# Patient Record
Sex: Female | Born: 1980 | Race: White | Hispanic: No | Marital: Married | Smoking: Never smoker
Health system: Southern US, Community
[De-identification: ages and names within clinical notes are randomized; demographics above are authoritative.]

## PROBLEM LIST (undated history)

## (undated) DIAGNOSIS — B019 Varicella without complication: Secondary | ICD-10-CM

## (undated) DIAGNOSIS — N200 Calculus of kidney: Secondary | ICD-10-CM

## (undated) DIAGNOSIS — N879 Dysplasia of cervix uteri, unspecified: Secondary | ICD-10-CM

## (undated) DIAGNOSIS — O1490 Unspecified pre-eclampsia, unspecified trimester: Secondary | ICD-10-CM

## (undated) HISTORY — DX: Dysplasia of cervix uteri, unspecified: N87.9

## (undated) HISTORY — DX: Varicella without complication: B01.9

## (undated) HISTORY — DX: Calculus of kidney: N20.0

## (undated) HISTORY — DX: Unspecified pre-eclampsia, unspecified trimester: O14.90

---

## 1998-12-15 HISTORY — PX: LEEP: SHX91

## 2007-03-09 ENCOUNTER — Ambulatory Visit: Payer: Self-pay | Admitting: Obstetrics & Gynecology

## 2007-03-19 ENCOUNTER — Ambulatory Visit: Payer: Self-pay | Admitting: Unknown Physician Specialty

## 2007-05-26 ENCOUNTER — Inpatient Hospital Stay: Payer: Self-pay

## 2012-05-07 ENCOUNTER — Inpatient Hospital Stay: Payer: Self-pay | Admitting: Obstetrics and Gynecology

## 2012-05-07 LAB — PIH PROFILE
Anion Gap: 10 (ref 7–16)
BUN: 12 mg/dL (ref 7–18)
Calcium, Total: 8.7 mg/dL (ref 8.5–10.1)
Chloride: 106 mmol/L (ref 98–107)
Co2: 24 mmol/L (ref 21–32)
Creatinine: 0.74 mg/dL (ref 0.60–1.30)
EGFR (African American): >60
EGFR (Non-African Amer.): >60
Glucose: 83 mg/dL (ref 65–99)
HCT: 36.9 % (ref 35.0–47.0)
HGB: 12.7 g/dL (ref 12.0–16.0)
MCH: 29.7 pg (ref 26.0–34.0)
MCHC: 34.3 g/dL (ref 32.0–36.0)
MCV: 87 fL (ref 80–100)
Osmolality: 278 (ref 275–301)
Platelet: 180 10*3/uL (ref 150–440)
Potassium: 4.1 mmol/L (ref 3.5–5.1)
RBC: 4.26 10*6/uL (ref 3.80–5.20)
RDW: 14.1 % (ref 11.5–14.5)
SGOT(AST): 17 U/L (ref 15–37)
Sodium: 140 mmol/L (ref 136–145)
Uric Acid: 6.7 mg/dL — ABNORMAL HIGH (ref 2.6–6.0)
WBC: 9.1 10*3/uL (ref 3.6–11.0)

## 2012-05-07 LAB — CBC WITH DIFFERENTIAL/PLATELET
Basophil #: 0 10*3/uL (ref 0.0–0.1)
Basophil %: 0.2 %
Eosinophil #: 0.1 10*3/uL (ref 0.0–0.7)
HGB: 13.7 g/dL (ref 12.0–16.0)
Lymphocyte #: 1.8 10*3/uL (ref 1.0–3.6)
MCV: 87 fL (ref 80–100)
Monocyte #: 0.5 x10 3/mm (ref 0.2–0.9)
Neutrophil #: 7.3 10*3/uL — ABNORMAL HIGH (ref 1.4–6.5)
RBC: 4.6 10*6/uL (ref 3.80–5.20)
WBC: 9.7 10*3/uL (ref 3.6–11.0)

## 2012-05-07 LAB — PROTEIN / CREATININE RATIO, URINE: Creatinine, Urine: 141.5 mg/dL — ABNORMAL HIGH (ref 30.0–125.0)

## 2015-04-24 NOTE — H&P (Signed)
L&D Evaluation:  History:   HPI 34 yo G2P1001 at 6631w3d gestational age dated by a 6 week ultrasound who presents for evaluation of elevated blood pressures in clinic today.  Her pregnancy is complicated by a history of preeclampsia with G1.  She had baseline labs early in pregnancy, which were within normal limits.  Her baseline 24h urine total protein was 144mg .  She has a history of a LEEP procedure and her cervix was monitored during her pregnancy for premature shortening.  She notes positive fetal movement, denies leakage of fluid, vaginal bleeding and contractions.  She also denies headache, visual disturbances, and RUQ pain.  Blood type A+, VZI, HBsAg neg, RI, RPR NR, GBS neg (04/23/12).    Patient's Medical History Cervical dysplasia     Patient's Surgical History LEEP     Medications Pre Natal Vitamins     Allergies PCN, clindamycin    Social History none  EtOH when not pregnant     Family History Non-Contributory    ROS:   ROS All systems were reviewed.  HEENT, CNS, GI, GU, Respiratory, CV, Renal and Musculoskeletal systems were found to be normal., unless noted in HPI   Exam:   Vital Signs 122(on left side) -159/74(left side)-96     General no apparent distress    Mental Status clear     Chest clear     Heart normal sinus rhythm    Abdomen gravid, non-tender    Estimated Fetal Weight Average for gestational age    Back no CVAT    Edema no edema     Reflexes 2+     FHT normal rate with no decels    FHT Description 150/mod var/+accels/no decels    Ucx uterine irritability    Skin no lesions   Impression:   Impression Preeclampsia evaluation   Plan:   Plan EFM/NST, monitor BP    Comments HELLP labs Urine protein to creatinine ratio   Electronic Signatures: Conard NovakJackson, Tanny Harnack D (MD)  (Signed 24-May-13 16:59)  Authored: L&D Evaluation   Last Updated: 24-May-13 16:59 by Conard NovakJackson, Kade Demicco D (MD)

## 2015-09-17 ENCOUNTER — Encounter: Payer: Self-pay | Admitting: Family Medicine

## 2015-09-17 ENCOUNTER — Ambulatory Visit (INDEPENDENT_AMBULATORY_CARE_PROVIDER_SITE_OTHER): Payer: BC Managed Care – PPO | Admitting: Family Medicine

## 2015-09-17 VITALS — BP 130/82 | HR 96 | Temp 99.2°F | Ht 66.5 in | Wt 196.0 lb

## 2015-09-17 DIAGNOSIS — Z13220 Encounter for screening for lipoid disorders: Secondary | ICD-10-CM

## 2015-09-17 DIAGNOSIS — Z0000 Encounter for general adult medical examination without abnormal findings: Secondary | ICD-10-CM | POA: Diagnosis not present

## 2015-09-17 DIAGNOSIS — R0602 Shortness of breath: Secondary | ICD-10-CM | POA: Diagnosis not present

## 2015-09-17 DIAGNOSIS — R7989 Other specified abnormal findings of blood chemistry: Secondary | ICD-10-CM

## 2015-09-17 DIAGNOSIS — F410 Panic disorder [episodic paroxysmal anxiety] without agoraphobia: Secondary | ICD-10-CM | POA: Diagnosis not present

## 2015-09-17 NOTE — Progress Notes (Signed)
Pre visit review using our clinic review tool, if applicable. No additional management support is needed unless otherwise documented below in the visit note. 

## 2015-09-17 NOTE — Patient Instructions (Signed)
It was nice to see you today.  Call if you have any concerns/questions.  Follow up annually or sooner if needed.  Take care  Dr. Adriana Simas

## 2015-09-18 ENCOUNTER — Encounter: Payer: Self-pay | Admitting: Family Medicine

## 2015-09-18 DIAGNOSIS — F410 Panic disorder [episodic paroxysmal anxiety] without agoraphobia: Secondary | ICD-10-CM | POA: Insufficient documentation

## 2015-09-18 DIAGNOSIS — Z0000 Encounter for general adult medical examination without abnormal findings: Secondary | ICD-10-CM | POA: Insufficient documentation

## 2015-09-18 LAB — CBC
HEMATOCRIT: 41.8 % (ref 36.0–46.0)
HEMOGLOBIN: 14.4 g/dL (ref 12.0–15.0)
MCHC: 34.3 g/dL (ref 30.0–36.0)
MCV: 85.3 fl (ref 78.0–100.0)
Platelets: 340 10*3/uL (ref 150.0–400.0)
RBC: 4.91 Mil/uL (ref 3.87–5.11)
RDW: 13.2 % (ref 11.5–15.5)
WBC: 9 10*3/uL (ref 4.0–10.5)

## 2015-09-18 LAB — BRAIN NATRIURETIC PEPTIDE: PRO B NATRI PEPTIDE: 18 pg/mL (ref 0.0–100.0)

## 2015-09-18 LAB — LIPID PANEL
CHOL/HDL RATIO: 4
CHOLESTEROL: 166 mg/dL (ref 0–200)
HDL: 41.6 mg/dL (ref >39.00)
NonHDL: 124.35
Triglycerides: 219 mg/dL — ABNORMAL HIGH (ref 0.0–149.0)
VLDL: 43.8 mg/dL — ABNORMAL HIGH (ref 0.0–40.0)

## 2015-09-18 LAB — D-DIMER, QUANTITATIVE: D-Dimer, Quant: 0.33 ug/mL-FEU (ref 0.00–0.48)

## 2015-09-18 LAB — LDL CHOLESTEROL, DIRECT: Direct LDL: 108 mg/dL

## 2015-09-18 NOTE — Assessment & Plan Note (Signed)
Patient with recent shortness of breath. There are no signs of heart failure on exam. Cardiac etiology very unlikely given age and lack of risk factors. Given recent increase in stress this is likely panic disorder. I will obtain d-dimer and BNP today to ensure no underlying CHF/PE. Advised deep breathing, yoga/relaxation techniques.  Offered therapy and pharmacotherapy and patient declined. Will monitor closely.

## 2015-09-18 NOTE — Assessment & Plan Note (Signed)
Pap smear up to date. Will get flu at work.  Tetatus up to date as well as HIV screening. Labs: Lipid panel today.

## 2015-09-18 NOTE — Progress Notes (Signed)
Subjective:  Patient ID: Beth Murphy, female    DOB: 24-Dec-1980  Age: 34 y.o. MRN: [id number]  CC: Establish care; SOB, ? Panic attack  HPI Beth Murphy is a 34 y.o. female presents to the clinic today to establish care.   Preventative Healthcare  Pap smear: UTD; 12/16.  Mammogram: N/A.  Colonoscopy: N/A.  Immunizations: In need of flu shot (will get a work).  Labs: In need of lipid panel.  Exercise: Currently getting back into the swing of exercising.  Alcohol use: See below.  Smoking/tobacco use: See below.  STD/HIV testing: Up to date.  Regular dental exams: Yes.   Wears seat belt: Yes.   SOB, ? Panic attack  Patient reports that since this last Thursday she has had several bouts of acute shortness of breath.  She states that it occurs only at night. It has woken her out of sleep. She reports associated chest tightness.  She denies any orthopnea.   She states that it does not occur during the day. She is able to exercise without difficulty.  No recent travel or prolonged immobility.  She does note recent increased stress particularly at work.  She states her shortness of breath last for a few minutes and then resolves following slow deep breathing.  No known exacerbating factors.  PMH, Surgical Hx, Family Hx, Social History reviewed and updated as below. Past Medical History  Diagnosis Date  . Chicken pox   . Kidney stones     During pregnancy   Past Surgical History  Procedure Laterality Date  . No past surgery     Family History  Problem Relation Age of Onset  . Hypertension Father   . Heart disease Maternal Grandmother   . Heart disease Maternal Grandfather   . Hypertension Maternal Grandfather   . Heart disease Paternal Grandfather   . Hypertension Paternal Grandfather    Social History  Substance Use Topics  . Smoking status: Never Smoker   . Smokeless tobacco: Never Used  . Alcohol Use: 1.2 - 1.8 oz/week    2-3  Standard drinks or equivalent per week   Review of Systems  Respiratory: Positive for shortness of breath.   Psychiatric/Behavioral:       Anxiety.  All other systems negative.   Objective:   Today's Vitals: BP 130/82 mmHg  Pulse 96  Temp(Src) 99.2 F (37.3 C) (Oral)  Ht 5' 6.5" (1.689 m)  Wt 196 lb (88.905 kg)  BMI 31.16 kg/m2  SpO2 99%  LMP 09/03/2015  Physical Exam  Constitutional: She is oriented to person, place, and time. She appears well-developed and well-nourished. No distress.  HENT:  Head: Normocephalic and atraumatic.  Nose: Nose normal.  Mouth/Throat: Oropharynx is clear and moist. No oropharyngeal exudate.  Normal TM's bilaterally.   Eyes: Conjunctivae are normal. No scleral icterus.  Neck: Neck supple. No thyromegaly present.  Cardiovascular: Normal rate and regular rhythm.   No murmur heard. Pulmonary/Chest: Effort normal and breath sounds normal. She has no wheezes. She has no rales.  Abdominal: Soft. She exhibits no distension. There is no tenderness. There is no rebound and no guarding.  Musculoskeletal: Normal range of motion. She exhibits no edema.  Lymphadenopathy:    She has no cervical adenopathy.  Neurological: She is alert and oriented to person, place, and time.  Skin: Skin is warm and dry. No rash noted.  Psychiatric: She has a normal mood and affect.  Vitals reviewed.  Assessment & Plan:   Problem  List Items Addressed This Visit    Preventative health care    Pap smear up to date. Will get flu at work.  Tetatus up to date as well as HIV screening. Labs: Lipid panel today.      Panic disorder    Patient with recent shortness of breath. There are no signs of heart failure on exam. Cardiac etiology very unlikely given age and lack of risk factors. Given recent increase in stress this is likely panic disorder. I will obtain d-dimer and BNP today to ensure no underlying CHF/PE. Advised deep breathing, yoga/relaxation techniques.  Offered  therapy and pharmacotherapy and patient declined. Will monitor closely.       Other Visit Diagnoses    SOB (shortness of breath)    -  Primary    Relevant Orders    CBC    D-Dimer, Quantitative    B Nat Peptide    Screening for lipid disorders        Relevant Orders    Lipid panel      Outpatient Encounter Prescriptions as of 09/17/2015  Medication Sig  . levonorgestrel (MIRENA, 52 MG,) 20 MCG/24HR IUD 1 each by Intrauterine route once.   No facility-administered encounter medications on file as of 09/17/2015.   Follow-up: Annually or sooner if needed.    Tommie Sams DO

## 2018-04-19 ENCOUNTER — Telehealth: Payer: Self-pay | Admitting: Obstetrics & Gynecology

## 2018-04-19 ENCOUNTER — Encounter: Payer: Self-pay | Admitting: Obstetrics & Gynecology

## 2018-04-19 ENCOUNTER — Ambulatory Visit (INDEPENDENT_AMBULATORY_CARE_PROVIDER_SITE_OTHER): Payer: BC Managed Care – PPO | Admitting: Obstetrics & Gynecology

## 2018-04-19 VITALS — BP 140/80 | Ht 67.0 in | Wt 202.0 lb

## 2018-04-19 DIAGNOSIS — Z124 Encounter for screening for malignant neoplasm of cervix: Secondary | ICD-10-CM

## 2018-04-19 DIAGNOSIS — T8389XA Other specified complication of genitourinary prosthetic devices, implants and grafts, initial encounter: Secondary | ICD-10-CM | POA: Diagnosis not present

## 2018-04-19 DIAGNOSIS — Z0000 Encounter for general adult medical examination without abnormal findings: Secondary | ICD-10-CM

## 2018-04-19 DIAGNOSIS — T8332XA Displacement of intrauterine contraceptive device, initial encounter: Secondary | ICD-10-CM

## 2018-04-19 NOTE — Telephone Encounter (Signed)
mirena with [facility] on 5/22 at 2:50

## 2018-04-19 NOTE — Progress Notes (Signed)
HPI:      [name] is a 37 y.o. No obstetric history on file. who LMP was Patient's last menstrual period was 04/01/2018., she presents today for her annual examination. The patient has no complaints today. The patient is sexually active. Her last pap: approximate date 2015 and was normal.  Prior LEEP. The patient does perform self breast exams.  There is no notable family history of breast or ovarian cancer in her family.  The patient has regular exercise: yes.  The patient denies current symptoms of depression.    GYN History: Contraception: IUD  PMHx: Past Medical History:  Diagnosis Date  . Calculus of kidney   . Cervical dysplasia   . Chicken pox   . Kidney stones    During pregnancy  . Pre-eclampsia    with prior pregnancy   Past Surgical History:  Procedure Laterality Date  . LEEP  2000   Family History  Problem Relation Age of Onset  . Hypertension Father   . Heart disease Maternal Grandmother   . Dementia Maternal Grandmother   . Heart disease Maternal Grandfather   . Hypertension Maternal Grandfather   . Heart disease Paternal Grandfather   . Hypertension Paternal Grandfather    Social History   Tobacco Use  . Smoking status: Never Smoker  . Smokeless tobacco: Never Used  Substance Use Topics  . Alcohol use: Yes    Alcohol/week: 1.2 - 1.8 oz    Types: 2 - 3 Standard drinks or equivalent per week  . Drug use: Yes    Comment: marijuana    Current Outpatient Medications:  .  levonorgestrel (MIRENA, 52 MG,) 20 MCG/24HR IUD, 1 each by Intrauterine route once., Disp: , Rfl:  Allergies: Clindamycin/lincomycin and Penicillins  Review of Systems  Constitutional: Negative for chills, fever and malaise/fatigue.  HENT: Negative for congestion, sinus pain and sore throat.   Eyes: Negative for blurred vision and pain.  Respiratory: Negative for cough and wheezing.   Cardiovascular: Negative for chest pain and leg swelling.  Gastrointestinal:  Negative for abdominal pain, constipation, diarrhea, heartburn, nausea and vomiting.  Genitourinary: Negative for dysuria, frequency, hematuria and urgency.  Musculoskeletal: Negative for back pain, joint pain, myalgias and neck pain.  Skin: Negative for itching and rash.  Neurological: Negative for dizziness, tremors and weakness.  Endo/Heme/Allergies: Does not bruise/bleed easily.  Psychiatric/Behavioral: Negative for depression. The patient is not nervous/anxious and does not have insomnia.     Objective: BP 140/80   Ht  (1.702 m)   Wt 202 lb (91.6 kg)   LMP 04/01/2018   BMI 31.64 kg/m   Filed Weights   04/19/18 0808  Weight: 202 lb (91.6 kg)   Body mass index is 31.64 kg/m. Physical Exam  Constitutional: She is oriented to person, place, and time. She appears well-developed and well-nourished. No distress.  Genitourinary: Rectum normal, vagina normal and uterus normal. Pelvic exam was performed with patient supine. There is no rash or lesion on the right labia. There is no rash or lesion on the left labia. Vagina exhibits no lesion. No bleeding in the vagina. Right adnexum does not display mass and does not display tenderness. Left adnexum does not display mass and does not display tenderness. Cervix does not exhibit motion tenderness, lesion, friability or polyp.   Uterus is mobile and midaxial. Uterus is not enlarged or exhibiting a mass.  HENT:  Head: Normocephalic and atraumatic. Head is without laceration.  Right Ear: Hearing normal.  Left Ear: Hearing normal.  Nose: No epistaxis.  No foreign bodies.  Mouth/Throat: Uvula is midline, oropharynx is clear and moist and mucous membranes are normal.  Eyes: Pupils are equal, round, and reactive to light.  Neck: Normal range of motion. Neck supple. No thyromegaly present.  Cardiovascular: Normal rate and regular rhythm. Exam reveals no gallop and no friction rub.  No murmur heard. Pulmonary/Chest: Effort normal and breath  sounds normal. No respiratory distress. She has no wheezes. Right breast exhibits no mass, no skin change and no tenderness. Left breast exhibits no mass, no skin change and no tenderness.  Abdominal: Soft. Bowel sounds are normal. She exhibits no distension. There is no tenderness. There is no rebound.  Musculoskeletal: Normal range of motion.  Neurological: She is alert and oriented to person, place, and time. No cranial nerve deficit.  Skin: Skin is warm and dry.  Psychiatric: She has a normal mood and affect. Judgment normal.  Vitals reviewed.  Pelvic exam:  Two IUD strings and white portion of IUD tip seen coming from the cervical os. EGBUS, vaginal vault and cervix: within normal limits  IUD Removal Strings of IUD identified and grasped.  IUD removed without problem.  Pt tolerated this well.  IUD noted to be intact.  Assessment:  ANNUAL EXAM 1. Annual physical exam   2. Screening for cervical cancer   3. IUD migration, initial encounter [facility])    Screening Plan:            1.  Cervical Screening-  Pap smear done today  2. Labs managed by PCP  3. Counseling for contraception: IUD seen on exam so removed;  plan reinsertion after next period    F/U  Return in 2 weeks (on 05/05/2018) for IUD appt this day.  Annamarie Major, MD, [facility], [facility] 04/19/2018  8:35 AM

## 2018-04-19 NOTE — Telephone Encounter (Signed)
Noted. Will order to arrive by apt date/time. 

## 2018-04-19 NOTE — Patient Instructions (Addendum)
PAP every three years Labs yearly (with PCP)  Levonorgestrel intrauterine device (IUD)  Take Ibuprofen 800 mg prior to appt  What is this medicine? LEVONORGESTREL IUD (LEE voe nor jes trel) is a contraceptive (birth control) device. The device is placed inside the uterus by a healthcare professional. It is used to prevent pregnancy. This device can also be used to treat heavy bleeding that occurs during your period. This medicine may be used for other purposes; ask your health care provider or pharmacist if you have questions. COMMON BRAND NAME(S): Cameron Ali What should I tell my health care provider before I take this medicine? They need to know if you have any of these conditions: -abnormal Pap smear -cancer of the breast, uterus, or cervix -diabetes -endometritis -genital or pelvic infection now or in the past -have more than one sexual partner or your partner has more than one partner -heart disease -history of an ectopic or tubal pregnancy -immune system problems -IUD in place -liver disease or tumor -problems with blood clots or take blood-thinners -seizures -use intravenous drugs -uterus of unusual shape -vaginal bleeding that has not been explained -an unusual or allergic reaction to levonorgestrel, other hormones, silicone, or polyethylene, medicines, foods, dyes, or preservatives -pregnant or trying to get pregnant -breast-feeding How should I use this medicine? This device is placed inside the uterus by a health care professional. Talk to your pediatrician regarding the use of this medicine in children. Special care may be needed. Overdosage: If you think you have taken too much of this medicine contact a poison control center or emergency room at once. NOTE: This medicine is only for you. Do not share this medicine with others. What if I miss a dose? This does not apply. Depending on the brand of device you have inserted, the device will need to  be replaced every 3 to 5 years if you wish to continue using this type of birth control. What may interact with this medicine? Do not take this medicine with any of the following medications: -amprenavir -bosentan -fosamprenavir This medicine may also interact with the following medications: -aprepitant -armodafinil -barbiturate medicines for inducing sleep or treating seizures -bexarotene -boceprevir -griseofulvin -medicines to treat seizures like carbamazepine, ethotoin, felbamate, oxcarbazepine, phenytoin, topiramate -modafinil -pioglitazone -rifabutin -rifampin -rifapentine -some medicines to treat HIV infection like atazanavir, efavirenz, indinavir, lopinavir, nelfinavir, tipranavir, ritonavir -St. John's wort -warfarin This list may not describe all possible interactions. Give your health care provider a list of all the medicines, herbs, non-prescription drugs, or dietary supplements you use. Also tell them if you smoke, drink alcohol, or use illegal drugs. Some items may interact with your medicine. What should I watch for while using this medicine? Visit your doctor or health care professional for regular check ups. See your doctor if you or your partner has sexual contact with others, becomes HIV positive, or gets a sexual transmitted disease. This product does not protect you against HIV infection (AIDS) or other sexually transmitted diseases. You can check the placement of the IUD yourself by reaching up to the top of your vagina with clean fingers to feel the threads. Do not pull on the threads. It is a good habit to check placement after each menstrual period. Call your doctor right away if you feel more of the IUD than just the threads or if you cannot feel the threads at all. The IUD may come out by itself. You may become pregnant if the device comes out. If you  notice that the IUD has come out use a backup birth control method like condoms and call your health care  provider. Using tampons will not change the position of the IUD and are okay to use during your period. This IUD can be safely scanned with magnetic resonance imaging (MRI) only under specific conditions. Before you have an MRI, tell your healthcare provider that you have an IUD in place, and which type of IUD you have in place. What side effects may I notice from receiving this medicine? Side effects that you should report to your doctor or health care professional as soon as possible: -allergic reactions like skin rash, itching or hives, swelling of the face, lips, or tongue -fever, flu-like symptoms -genital sores -high blood pressure -no menstrual period for 6 weeks during use -pain, swelling, warmth in the leg -pelvic pain or tenderness -severe or sudden headache -signs of pregnancy -stomach cramping -sudden shortness of breath -trouble with balance, talking, or walking -unusual vaginal bleeding, discharge -yellowing of the eyes or skin Side effects that usually do not require medical attention (report to your doctor or health care professional if they continue or are bothersome): -acne -breast pain -change in sex drive or performance -changes in weight -cramping, dizziness, or faintness while the device is being inserted -headache -irregular menstrual bleeding within first 3 to 6 months of use -nausea This list may not describe all possible side effects. Call your doctor for medical advice about side effects. You may report side effects to FDA at 1-800-FDA-1088. Where should I keep my medicine? This does not apply. NOTE: This sheet is a summary. It may not cover all possible information. If you have questions about this medicine, talk to your doctor, pharmacist, or health care provider.  2018 Elsevier/Gold Standard (2016-09-12 14:14:56)

## 2018-04-22 LAB — IGP, APTIMA HPV
HPV Aptima: NEGATIVE
PAP Smear Comment: 0

## 2018-05-05 ENCOUNTER — Ambulatory Visit: Payer: BC Managed Care – PPO | Admitting: Obstetrics & Gynecology

## 2018-05-05 NOTE — Telephone Encounter (Signed)
{  Patient is calling to cancel appointment will not need Mirena device

## 2020-04-03 ENCOUNTER — Encounter: Payer: Self-pay | Admitting: Nurse Practitioner

## 2020-04-03 ENCOUNTER — Ambulatory Visit: Payer: BC Managed Care – PPO | Admitting: Nurse Practitioner

## 2020-04-03 ENCOUNTER — Other Ambulatory Visit: Payer: Self-pay

## 2020-04-03 VITALS — BP 134/84 | HR 122 | Temp 97.2°F | Ht 66.0 in | Wt 203.0 lb

## 2020-04-03 DIAGNOSIS — R000 Tachycardia, unspecified: Secondary | ICD-10-CM

## 2020-04-03 DIAGNOSIS — E669 Obesity, unspecified: Secondary | ICD-10-CM | POA: Diagnosis not present

## 2020-04-03 DIAGNOSIS — I10 Essential (primary) hypertension: Secondary | ICD-10-CM

## 2020-04-03 HISTORY — DX: Essential (primary) hypertension: I10

## 2020-04-03 HISTORY — DX: Tachycardia, unspecified: R00.0

## 2020-04-03 NOTE — Assessment & Plan Note (Signed)
She will monitor her blood pressures at home and bring in readings.  Continue work on [employer], weight loss, and walking at this point.

## 2020-04-03 NOTE — Progress Notes (Signed)
New Patient Office Visit  Subjective:  Patient ID: Beth Murphy, female    DOB: 05-08-1981  Age: 39 y.o. MRN: [id number]  CC:  Chief Complaint  Patient presents with  . Establish Care    HPI Beth Murphy presents to establish care. She had previously seen Dr. Lacinda Axon in 2016 for one preventative visit.  She had shortness of breath that was attributed to panic disorder.   HTN:She presents not on medication.  She will monitor her blood pressures at home and bring in readings.   BP Readings from Last 3 Encounters:  04/03/20 134/84  04/19/18 140/80  09/17/15 130/82    Tachycardia: She has noted episodes of tachycardia and reports her resting heart rate is between 90 and 110.  Her apple watch tells her to take a deep breath periodically through the day and she checks her heart rate tracking which ranges from 73-135.  Another day it ranges 88-1 31.  She presents to the office today with heart rate 122 and repeated at rest of 110.  She has no chest pain, pressure, heaviness,or tightness.  She never feels skipped beats, dizziness, lightheaded, weakness.  Patient reports her heart rate is always elevated and in 2018-2019 she was doing intense [employer] without any symptoms.  FH: Positive for hypertension.  Negative for heart disease or stroke.  She has never smoked.  Needs lipids rechecked.  BMI 32.77.  Lab Results  Component Value Date   CHOL 166 09/17/2015   HDL 41.60 09/17/2015   LDLDIRECT 108.0 09/17/2015   TRIG 219.0 (H) 09/17/2015   CHOLHDL 4 09/17/2015    Anxiety: No recent panic attacks or issues with anxiety. 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 PHQ-9: 1, GAD-7:  3.  Past Medical History:  Diagnosis Date  . Calculus of kidney   . Cervical dysplasia   . Chicken pox   . HTN (hypertension) 04/03/2020  . Kidney stones     During pregnancy  . Pre-eclampsia    with prior pregnancy  . Tachycardia 04/03/2020    Past Surgical History:  Procedure Laterality Date  . LEEP  2000    Family History  Problem Relation Age of Onset  . Hypertension Father   . Heart disease Maternal Grandmother   . Dementia Maternal Grandmother   . Heart disease Maternal Grandfather   . Hypertension Maternal Grandfather   . Heart disease Paternal Grandfather   . Hypertension Paternal Grandfather     Social History   Socioeconomic History  . Marital status: Married    Spouse name: Not on file  . Number of children: Not on file  . Years of education: Not on file  . Highest education level: Not on file  Occupational History  . Not on file  Tobacco Use  . Smoking status: Never Smoker  . Smokeless tobacco: Never Used  Substance and Sexual Activity  . Alcohol use: Yes    Alcohol/week: 2.0 - 3.0 standard drinks    Types: 2 - 3 Standard drinks or equivalent per week  . Drug use: Not Currently    Comment: marijuana  . Sexual activity: Yes    Partners: Male    Birth control/protection: Condom  Other Topics Concern  . Not on file  Social History Narrative   Married with 2 girls 7-13. Accepted into a doctoral program for  education.   Social Determinants of Health   Financial Resource Strain:   . Difficulty of Paying Living Expenses:   Food Insecurity:   . Worried About Charity fundraiser in the Last Year:   . Arboriculturist in the Last Year:   Transportation Needs:   . Film/video editor (Medical):   Marland Kitchen Lack of Transportation (Non-Medical):   Physical Activity:   . Days of Exercise per Week:   . Minutes of Exercise per Session:   Stress:   . Feeling of Stress :   Social Connections:   . Frequency of Communication with Friends and Family:   . Frequency of Social Gatherings with Friends and Family:   . Attends Religious Services:   . Active Member of Clubs or Organizations:   . Attends Theatre manager Meetings:   Marland Kitchen Marital Status:   Intimate Partner Violence:   . Fear of Current or Ex-Partner:   . Emotionally Abused:   Marland Kitchen Physically Abused:   . Sexually Abused:    Review of Systems  Constitutional: Negative for appetite change, chills, fatigue, fever and unexpected weight change.  HENT: Negative for congestion and sore throat.   Eyes: Negative.   Respiratory: Negative for cough and shortness of breath.   Cardiovascular: Negative for chest pain, palpitations and leg swelling.  Gastrointestinal: Negative for abdominal pain, blood in stool, constipation and diarrhea.  Endocrine: Negative for cold intolerance and heat intolerance.  Genitourinary: Negative for dysuria and frequency.  Musculoskeletal: Negative for arthralgias and myalgias.  Skin: Negative for rash.  Allergic/Immunologic: Negative for environmental allergies, food allergies and immunocompromised state.  Neurological: Negative for dizziness, seizures, syncope, light-headedness and headaches.  Hematological: Negative for adenopathy. Does not bruise/bleed easily.  Psychiatric/Behavioral:       No concerns about depression or anxiety.    Objective:   Today's Vitals: BP 134/84   Pulse (!) 122   Temp (!) 97.2 F (36.2 C) (Temporal)   Ht 5' 6"  (1.676 m)   Wt 203 lb (92.1 kg)   LMP 03/11/2020 (Exact Date)   SpO2 99%   BMI 32.77 kg/m   Physical Exam Vitals reviewed.  Constitutional:      Appearance: Normal appearance.  HENT:     Head: Normocephalic and atraumatic.  Eyes:     Extraocular Movements: Extraocular movements intact.     Conjunctiva/sclera: Conjunctivae normal.     Pupils: Pupils are equal, round, and reactive to light.  Cardiovascular:     Rate and Rhythm: Regular rhythm. Tachycardia present.     Pulses: Normal pulses.  Pulmonary:     Effort: Pulmonary effort is normal. No respiratory distress.     Breath sounds: Normal breath sounds.  Abdominal:     Palpations: Abdomen is soft.      Tenderness: There is no abdominal tenderness.  Musculoskeletal:        General: Normal range of motion.     Cervical back: Normal range of motion and neck supple.     Right lower leg: No edema.     Left lower leg: No edema.  Lymphadenopathy:     Cervical: No cervical adenopathy.  Skin:    General: Skin is warm and dry.  Neurological:     General: No focal deficit present.     Mental Status: She is alert and oriented to person, place, and time.  Psychiatric:        Mood and Affect: Mood normal.  Behavior: Behavior normal.        Thought Content: Thought content normal.        Judgment: Judgment normal.     Assessment & Plan:   Problem List Items Addressed This Visit      Cardiovascular and Mediastinum   HTN (hypertension) - Primary   Relevant Orders   Comp Met (CMET)   Lipid Profile     Other   Tachycardia   Relevant Orders   EKG 12-Lead   CBC with Differential/Platelet   Ambulatory referral to Cardiology    Other Visit Diagnoses    Obesity (BMI 30.0-34.9)       Relevant Orders   TSH   HgB A1c   Vitamin D (25 hydroxy)      Outpatient Encounter Medications as of 04/03/2020  Medication Sig  . [DISCONTINUED] levonorgestrel (MIRENA, 52 MG,) 20 MCG/24HR IUD 1 each by Intrauterine route once.   No facility-administered encounter medications on file as of 04/03/2020.    I have placed a cardiology consult for exercise clearance for your fast heart rate.  Continue work on [employer], weight loss, and walking at this point.  Pending lab work, will consider HTN treatment and follow -up 2-3 weeks after that. Check BP at home.   This visit occurred during the SARS-CoV-2 public health emergency.  Safety protocols were in place, including screening questions prior to the visit, additional usage of staff PPE, and extensive cleaning of exam room while observing appropriate contact time as indicated for disinfecting solutions.   Follow-up: Return in about 3 weeks  (around 04/24/2020).   Denice Paradise, NP

## 2020-04-03 NOTE — Patient Instructions (Addendum)
It was great to meet you today.    Please go to the lab after this visit.  I have placed a cardiology consult for exercise clearance for your fast heart rate.  Continue work on [employer], weight loss, and walking at this point.  Pending lab work, will consider HTN treatment and follow -up 2-3 weeks after that.    . Hypertension, Adult High blood pressure (hypertension) is when the force of blood pumping through the arteries is too strong. The arteries are the blood vessels that carry blood from the heart throughout the body. Hypertension forces the heart to work harder to pump blood and may cause arteries to become narrow or stiff. Untreated or uncontrolled hypertension can cause a heart attack, heart failure, a stroke, kidney disease, and other problems. A blood pressure reading consists of a higher number over a lower number. Ideally, your blood pressure should be below 120/80. The first ("top") number is called the systolic pressure. It is a measure of the pressure in your arteries as your heart beats. The second ("bottom") number is called the diastolic pressure. It is a measure of the pressure in your arteries as the heart relaxes. What are the causes? The exact cause of this condition is not known. There are some conditions that result in or are related to high blood pressure. What increases the risk? Some risk factors for high blood pressure are under your control. The following factors may make you more likely to develop this condition:  Smoking.  Having type 2 diabetes mellitus, high cholesterol, or both.  Not getting enough exercise or physical activity.  Being overweight.  Having too much fat, sugar, calories, or salt (sodium) in your diet.  Drinking too much alcohol. Some risk factors for high blood pressure may be difficult or impossible to change. Some of these factors include:  Having chronic kidney disease.  Having a family history of high blood pressure.  Age.  Risk increases with age.  Race. You may be at higher risk if you are African American.  Gender. Men are at higher risk than women before age 44. After age 49, women are at higher risk than men.  Having obstructive sleep apnea.  Stress. What are the signs or symptoms? High blood pressure may not cause symptoms. Very high blood pressure (hypertensive crisis) may cause:  Headache.  Anxiety.  Shortness of breath.  Nosebleed.  Nausea and vomiting.  Vision changes.  Severe chest pain.  Seizures. How is this diagnosed? This condition is diagnosed by measuring your blood pressure while you are seated, with your arm resting on a flat surface, your legs uncrossed, and your feet flat on the floor. The cuff of the blood pressure monitor will be placed directly against the skin of your upper arm at the level of your heart. It should be measured at least twice using the same arm. Certain conditions can cause a difference in blood pressure between your right and left arms. Certain factors can cause blood pressure readings to be lower or higher than normal for a short period of time:  When your blood pressure is higher when you are in a health care provider's office than when you are at home, this is called white coat hypertension. Most people with this condition do not need medicines.  When your blood pressure is higher at home than when you are in a health care provider's office, this is called masked hypertension. Most people with this condition may need medicines to control  blood pressure. If you have a high blood pressure reading during one visit or you have normal blood pressure with other risk factors, you may be asked to:  Return on a different day to have your blood pressure checked again.  Monitor your blood pressure at home for 1 week or longer. If you are diagnosed with hypertension, you may have other blood or imaging tests to help your health care provider understand your overall  risk for other conditions. How is this treated? This condition is treated by making healthy lifestyle changes, such as eating healthy foods, exercising more, and reducing your alcohol intake. Your health care provider may prescribe medicine if lifestyle changes are not enough to get your blood pressure under control, and if:  Your systolic blood pressure is above 130.  Your diastolic blood pressure is above 80. Your personal target blood pressure may vary depending on your medical conditions, your age, and other factors. Follow these instructions at home: Eating and drinking   Eat a diet that is high in fiber and potassium, and low in sodium, added sugar, and fat. An example eating plan is called the DASH (Dietary Approaches to Stop Hypertension) diet. To eat this way: ? Eat plenty of fresh fruits and vegetables. Try to fill one half of your plate at each meal with fruits and vegetables. ? Eat whole grains, such as whole-wheat pasta, brown rice, or whole-grain bread. Fill about one fourth of your plate with whole grains. ? Eat or drink low-fat dairy products, such as skim milk or low-fat yogurt. ? Avoid fatty cuts of meat, processed or cured meats, and poultry with skin. Fill about one fourth of your plate with lean proteins, such as fish, chicken without skin, beans, eggs, or tofu. ? Avoid pre-made and processed foods. These tend to be higher in sodium, added sugar, and fat.  Reduce your daily sodium intake. Most people with hypertension should eat less than 1,500 mg of sodium a day.  Do not drink alcohol if: ? Your health care provider tells you not to drink. ? You are pregnant, may be pregnant, or are planning to become pregnant.  If you drink alcohol: ? Limit how much you use to:  0-1 drink a day for women.  0-2 drinks a day for men. ? Be aware of how much alcohol is in your drink. In the [place] one drink equals one 12 oz bottle of beer (355 mL), one 5 oz glass of wine (148 mL), or  one 1 oz glass of hard liquor (44 mL). Lifestyle   Work with your health care provider to maintain a healthy body weight or to lose weight. Ask what an ideal weight is for you.  Get at least 30 minutes of exercise most days of the week. Activities may include walking, swimming, or biking.  Include exercise to strengthen your muscles (resistance exercise), such as Pilates or lifting weights, as part of your weekly exercise routine. Try to do these types of exercises for 30 minutes at least 3 days a week.  Do not use any products that contain nicotine or tobacco, such as cigarettes, e-cigarettes, and chewing tobacco. If you need help quitting, ask your health care provider.  Monitor your blood pressure at home as told by your health care provider.  Keep all follow-up visits as told by your health care provider. This is important. Medicines  Take over-the-counter and prescription medicines only as told by your health care provider. Follow directions carefully. Blood pressure medicines  must be taken as prescribed.  Do not skip doses of blood pressure medicine. Doing this puts you at risk for problems and can make the medicine less effective.  Ask your health care provider about side effects or reactions to medicines that you should watch for. Contact a health care provider if you:  Think you are having a reaction to a medicine you are taking.  Have headaches that keep coming back (recurring).  Feel dizzy.  Have swelling in your ankles.  Have trouble with your vision. Get help right away if you:  Develop a severe headache or confusion.  Have unusual weakness or numbness.  Feel faint.  Have severe pain in your chest or abdomen.  Vomit repeatedly.  Have trouble breathing. Summary  Hypertension is when the force of blood pumping through your arteries is too strong. If this condition is not controlled, it may put you at risk for serious complications.  Your personal target  blood pressure may vary depending on your medical conditions, your age, and other factors. For most people, a normal blood pressure is less than 120/80.  Hypertension is treated with lifestyle changes, medicines, or a combination of both. Lifestyle changes include losing weight, eating a healthy, low-sodium diet, exercising more, and limiting alcohol. This information is not intended to replace advice given to you by your health care provider. Make sure you discuss any questions you have with your health care provider. Document Revised: 08/11/2018 Document Reviewed: 08/11/2018 Elsevier Patient Education  2020 [employer].

## 2020-04-03 NOTE — Assessment & Plan Note (Addendum)
I have placed a cardiology consult for exercise clearance for your fast heart rate. EKG shows sinus tachycardia without ST-T wave changes. Over read by Dr. Lorin Picket.  Routine labs pending to check CBC, lipids, electrolytes.

## 2020-04-04 LAB — CBC WITH DIFFERENTIAL/PLATELET
Basophils Absolute: 0.1 10*3/uL (ref 0.0–0.1)
Basophils Relative: 0.8 % (ref 0.0–3.0)
Eosinophils Absolute: 0.1 10*3/uL (ref 0.0–0.7)
Eosinophils Relative: 0.6 % (ref 0.0–5.0)
HCT: 41.4 % (ref 36.0–46.0)
Hemoglobin: 14.1 g/dL (ref 12.0–15.0)
Lymphocytes Relative: 19.2 % (ref 12.0–46.0)
Lymphs Abs: 1.7 10*3/uL (ref 0.7–4.0)
MCHC: 34 g/dL (ref 30.0–36.0)
MCV: 86.8 fl (ref 78.0–100.0)
Monocytes Absolute: 0.3 10*3/uL (ref 0.1–1.0)
Monocytes Relative: 3.9 % (ref 3.0–12.0)
Neutro Abs: 6.6 10*3/uL (ref 1.4–7.7)
Neutrophils Relative %: 75.5 % (ref 43.0–77.0)
Platelets: 330 10*3/uL (ref 150.0–400.0)
RBC: 4.77 Mil/uL (ref 3.87–5.11)
RDW: 14 % (ref 11.5–15.5)
WBC: 8.8 10*3/uL (ref 4.0–10.5)

## 2020-04-04 LAB — COMPREHENSIVE METABOLIC PANEL
ALT: 15 U/L (ref 0–35)
AST: 15 U/L (ref 0–37)
Albumin: 4.5 g/dL (ref 3.5–5.2)
Alkaline Phosphatase: 66 U/L (ref 39–117)
BUN: 12 mg/dL (ref 6–23)
CO2: 24 mEq/L (ref 19–32)
Calcium: 9.7 mg/dL (ref 8.4–10.5)
Chloride: 101 mEq/L (ref 96–112)
Creatinine, Ser: 0.69 mg/dL (ref 0.40–1.20)
GFR: 94 mL/min (ref >60.00)
Glucose, Bld: 110 mg/dL — ABNORMAL HIGH (ref 70–99)
Potassium: 4 mEq/L (ref 3.5–5.1)
Sodium: 136 mEq/L (ref 135–145)
Total Bilirubin: 0.4 mg/dL (ref 0.2–1.2)
Total Protein: 7.9 g/dL (ref 6.0–8.3)

## 2020-04-04 LAB — TSH: TSH: 4.55 u[IU]/mL — ABNORMAL HIGH (ref 0.35–4.50)

## 2020-04-04 LAB — LIPID PANEL
Cholesterol: 181 mg/dL (ref 0–200)
HDL: 46.3 mg/dL (ref >39.00)
NonHDL: 134.24
Total CHOL/HDL Ratio: 4
Triglycerides: 244 mg/dL — ABNORMAL HIGH (ref 0.0–149.0)
VLDL: 48.8 mg/dL — ABNORMAL HIGH (ref 0.0–40.0)

## 2020-04-04 LAB — LDL CHOLESTEROL, DIRECT: Direct LDL: 103 mg/dL

## 2020-04-04 LAB — HEMOGLOBIN A1C: Hgb A1c MFr Bld: 5.4 % (ref 4.6–6.5)

## 2020-04-04 LAB — VITAMIN D 25 HYDROXY (VIT D DEFICIENCY, FRACTURES): VITD: 18.82 ng/mL — ABNORMAL LOW (ref 30.00–100.00)

## 2020-04-05 ENCOUNTER — Telehealth: Payer: Self-pay | Admitting: Nurse Practitioner

## 2020-04-05 NOTE — Telephone Encounter (Signed)
Please place future lab order for [facility]

## 2020-04-14 NOTE — Progress Notes (Signed)
Cardiology Office Note  Date:  04/16/2020   ID:  Beth Murphy, DOB [birth year], MRN [id number]  PCP:  Theadore Nan, NP   Chief Complaint  Patient presents with  . OTHER    Tachycardia/HTN. Meds reviewed verbally with pt.    HPI:  [name] is a 39 year old woman with  No past cardiac history  referred by [employer] for  consultation of her sinus tachycardia  Noted to have elevated heart rate when seen by primary care, EKG at that time rate in the 120s She has started monitoring heart rate at home Apple watch with heart rates 70s to 120s Heart rate at work 90s With stress such as in the doctor's office rate up to 120, was rushing today went to the wrong building thought she was late EKG showing heart rate 120s By the end of her visit today heart rate improved low 100  Denies any chest pain on exertion Previously did burn [employer] 1 year ago, no exercise intolerance Would get heart rate up to 160 with exertion Has not been exercising much over the past year secondary to Covid Now has a home gym set up to start an exercise program, wants to check and make sure everything is okay in terms of her heart rate before she starts  2 children , age 83 and age 51  Other upcoming stressors including Got into school, Looking for promotion at work  EKG personally reviewed by myself on todays visit Shows sinus tachycardia rate 128 bpm no significant ST-T wave changes  PMH:   has a past medical history of Calculus of kidney, Cervical dysplasia, Chicken pox, HTN (hypertension) (04/03/2020), Kidney stones, Pre-eclampsia, and Tachycardia (04/03/2020).  PSH:    Past Surgical History:  Procedure Laterality Date  . LEEP  2000    Current Outpatient Medications  Medication Sig Dispense Refill  . cholecalciferol (VITAMIN D3) 25 MCG (1000 UNIT) tablet Take 1,000 Units by mouth daily.     No current facility-administered medications for this visit.     Allergies:    Clindamycin/lincomycin and Penicillins   Social History:  The patient  reports that she has never smoked. She has never used smokeless tobacco. She reports current alcohol use of about 2.0 - 3.0 standard drinks of alcohol per week. She reports previous drug use.   Family History:   family history includes Dementia in her maternal grandmother; Heart disease in her maternal grandfather, maternal grandmother, and paternal grandfather; Hypertension in her father, maternal grandfather, and paternal grandfather.    Review of Systems: Review of Systems  Constitutional: Negative.   HENT: Negative.   Respiratory: Negative.   Cardiovascular: Negative.   Gastrointestinal: Negative.   Musculoskeletal: Negative.   Neurological: Negative.   Psychiatric/Behavioral: Negative.   All other systems reviewed and are negative.   PHYSICAL EXAM: VS:  BP (!) 140/105 (BP Location: Left Arm, Patient Position: Sitting, Cuff Size: Normal)   Pulse (!) 128   Ht 5\' 6"  (1.676 m)   Wt 209 lb (94.8 kg)   SpO2 99%   BMI 33.73 kg/m  , BMI Body mass index is 33.73 kg/m. GEN: Well nourished, well developed, in no acute distress HEENT: normal Neck: no JVD, carotid bruits, or masses Cardiac: RRR; no murmurs, rubs, or gallops,no edema  Respiratory:  clear to auscultation bilaterally, normal work of breathing GI: soft, nontender, nondistended, + BS MS: no deformity or atrophy Skin: warm and dry, no rash Neuro:  Strength and sensation are  intact Psych: euthymic mood, full affect   Recent Labs: 04/03/2020: ALT 15; BUN 12; Creatinine, Ser 0.69; Hemoglobin 14.1; Platelets 330.0; Potassium 4.0; Sodium 136; TSH 4.55    Lipid Panel Lab Results  Component Value Date   CHOL 181 04/03/2020   HDL 46.30 04/03/2020   TRIG 244.0 (H) 04/03/2020      Wt Readings from Last 3 Encounters:  04/16/20 209 lb (94.8 kg)  04/03/20 203 lb (92.1 kg)  04/19/18 202 lb (91.6 kg)     ASSESSMENT AND PLAN:  Problem List Items  Addressed This Visit      Cardiology Problems   HTN (hypertension)     Other   Tachycardia - Primary   Relevant Orders   EKG 12-Lead     Sinus tachycardia At home rates 70s to 110, Resting rate at work 90s, She does not track her heart rate overnight Had some 80s measurements over the weekend on Saturday As she is asymptomatic, clinical exam findings and EKG benign, will not start any medications at this time She will monitor heart rates on the weekends and overnight And will monitor blood pressure and contact us with some numbers No limitations on her exercise Recommend she restart her exercise program in a careful gradual way  Elevated blood pressure She'll monitor blood pressures at home and call our office with some numbers   Disposition:   F/U as needed   Total encounter time more than 60 minutes  Greater than 50% was spent in counseling and coordination of care with the patient    Signed, Esmond Plants, M.D., Ph.D. [place], [place]

## 2020-04-16 ENCOUNTER — Other Ambulatory Visit: Payer: Self-pay

## 2020-04-16 ENCOUNTER — Ambulatory Visit (INDEPENDENT_AMBULATORY_CARE_PROVIDER_SITE_OTHER): Payer: BC Managed Care – PPO | Admitting: Cardiovascular Disease

## 2020-04-16 ENCOUNTER — Encounter: Payer: Self-pay | Admitting: Cardiovascular Disease

## 2020-04-16 VITALS — BP 140/105 | HR 128 | Ht 66.0 in | Wt 209.0 lb

## 2020-04-16 DIAGNOSIS — I10 Essential (primary) hypertension: Secondary | ICD-10-CM

## 2020-04-16 DIAGNOSIS — R000 Tachycardia, unspecified: Secondary | ICD-10-CM | POA: Diagnosis not present

## 2020-04-16 NOTE — Patient Instructions (Signed)

## 2020-04-24 ENCOUNTER — Encounter: Payer: Self-pay | Admitting: Obstetrics & Gynecology

## 2020-04-24 ENCOUNTER — Ambulatory Visit (INDEPENDENT_AMBULATORY_CARE_PROVIDER_SITE_OTHER): Payer: BC Managed Care – PPO | Admitting: Obstetrics & Gynecology

## 2020-04-24 ENCOUNTER — Other Ambulatory Visit: Payer: Self-pay

## 2020-04-24 VITALS — BP 130/82 | Ht 66.0 in | Wt 205.0 lb

## 2020-04-24 DIAGNOSIS — Z1231 Encounter for screening mammogram for malignant neoplasm of breast: Secondary | ICD-10-CM

## 2020-04-24 DIAGNOSIS — Z01419 Encounter for gynecological examination (general) (routine) without abnormal findings: Secondary | ICD-10-CM

## 2020-04-24 NOTE — Patient Instructions (Signed)
PAP every three years Mammogram every year    Call [phone] to schedule at [facility] yearly (with PCP)   

## 2020-04-24 NOTE — Progress Notes (Signed)
HPI:      [name] is a 39 y.o. [id number] who LMP was Patient's last menstrual period was 04/08/2020., she presents today for her annual examination. The patient has no complaints today. The patient is sexually active. Her last pap: approximate date 2019 and was normal. The patient does perform self breast exams.  There is no notable family history of breast or ovarian cancer in her family.  The patient has regular exercise: yes.  The patient denies current symptoms of depression.  Reg cycles.  GYN History: Contraception: condoms  PMHx: Past Medical History:  Diagnosis Date  . Calculus of kidney   . Cervical dysplasia   . Chicken pox   . HTN (hypertension) 04/03/2020  . Kidney stones    During pregnancy  . Pre-eclampsia    with prior pregnancy  . Tachycardia 04/03/2020   Past Surgical History:  Procedure Laterality Date  . LEEP  2000   Family History  Problem Relation Age of Onset  . Hypertension Father   . Heart disease Maternal Grandmother   . Dementia Maternal Grandmother   . Heart disease Maternal Grandfather   . Hypertension Maternal Grandfather   . Heart disease Paternal Grandfather   . Hypertension Paternal Grandfather    Social History   Tobacco Use  . Smoking status: Never Smoker  . Smokeless tobacco: Never Used  Substance Use Topics  . Alcohol use: Yes    Alcohol/week: 2.0 - 3.0 standard drinks    Types: 2 - 3 Standard drinks or equivalent per week  . Drug use: Not Currently    Comment: marijuana    Current Outpatient Medications:  .  cholecalciferol (VITAMIN D3) 25 MCG (1000 UNIT) tablet, Take 1,000 Units by mouth daily., Disp: , Rfl:  Allergies: Clindamycin/lincomycin and Penicillins  Review of Systems  Constitutional: Negative for chills, fever and malaise/fatigue.  HENT: Negative for congestion, sinus pain and sore throat.   Eyes: Negative for blurred vision and pain.  Respiratory: Negative for cough and wheezing.   Cardiovascular:  Negative for chest pain and leg swelling.  Gastrointestinal: Negative for abdominal pain, constipation, diarrhea, heartburn, nausea and vomiting.  Genitourinary: Negative for dysuria, frequency, hematuria and urgency.  Musculoskeletal: Negative for back pain, joint pain, myalgias and neck pain.  Skin: Negative for itching and rash.  Neurological: Negative for dizziness, tremors and weakness.  Endo/Heme/Allergies: Does not bruise/bleed easily.  Psychiatric/Behavioral: Negative for depression. The patient is not nervous/anxious and does not have insomnia.     Objective: BP 130/82   Ht 5\' 6"  (1.676 m)   Wt 205 lb (93 kg)   LMP 04/08/2020   BMI 33.09 kg/m   Filed Weights   04/24/20 0801  Weight: 205 lb (93 kg)   Body mass index is 33.09 kg/m. Physical Exam Constitutional:      General: She is not in acute distress.    Appearance: She is well-developed.  Genitourinary:     Pelvic exam was performed with patient supine.     Vagina, uterus and rectum normal.     No lesions in the vagina.     No vaginal bleeding.     No cervical motion tenderness, friability, lesion or polyp.     Uterus is mobile.     Uterus is not enlarged.     No uterine mass detected.    Uterus is midaxial.     No right or left adnexal mass present.     Right adnexa not tender.  Left adnexa not tender.  HENT:     Head: Normocephalic and atraumatic. No laceration.     Right Ear: Hearing normal.     Left Ear: Hearing normal.     Mouth/Throat:     Pharynx: Uvula midline.  Eyes:     Pupils: Pupils are equal, round, and reactive to light.  Neck:     Thyroid: No thyromegaly.  Cardiovascular:     Rate and Rhythm: Normal rate and regular rhythm.     Heart sounds: No murmur. No friction rub. No gallop.   Pulmonary:     Effort: Pulmonary effort is normal. No respiratory distress.     Breath sounds: Normal breath sounds. No wheezing.  Chest:     Breasts:        Right: No mass, skin change or tenderness.         Left: No mass, skin change or tenderness.  Abdominal:     General: Bowel sounds are normal. There is no distension.     Palpations: Abdomen is soft.     Tenderness: There is no abdominal tenderness. There is no rebound.  Musculoskeletal:        General: Normal range of motion.     Cervical back: Normal range of motion and neck supple.  Neurological:     Mental Status: She is alert and oriented to person, place, and time.     Cranial Nerves: No cranial nerve deficit.  Skin:    General: Skin is warm and dry.  Psychiatric:        Judgment: Judgment normal.  Vitals reviewed.     Assessment:  ANNUAL EXAM 1. Women's annual routine gynecological examination   2. Encounter for screening mammogram for malignant neoplasm of breast      Screening Plan:            1.  Cervical Screening-  Pap smear not done today, discussed 3 year interval for PAP screening  2. Breast screening- Exam annually and mammogram>40 planned   3.  Labs managed by PCP  4. Counseling for contraception: condoms, plans vasec for husband soon    F/U  Return in about 1 year (around 04/24/2021) for Annual.  Annamarie Major, MD, [facility], [facility] 04/24/2020  8:20 AM

## 2020-04-25 ENCOUNTER — Other Ambulatory Visit: Payer: Self-pay

## 2020-04-25 ENCOUNTER — Encounter: Payer: Self-pay | Admitting: Nurse Practitioner

## 2020-04-25 ENCOUNTER — Ambulatory Visit: Payer: BC Managed Care – PPO | Admitting: Nurse Practitioner

## 2020-04-25 VITALS — BP 140/90 | HR 117 | Temp 96.9°F | Ht 66.0 in | Wt 205.0 lb

## 2020-04-25 DIAGNOSIS — R030 Elevated blood-pressure reading, without diagnosis of hypertension: Secondary | ICD-10-CM | POA: Diagnosis not present

## 2020-04-25 DIAGNOSIS — Z6833 Body mass index (BMI) 33.0-33.9, adult: Secondary | ICD-10-CM | POA: Diagnosis not present

## 2020-04-25 DIAGNOSIS — Z6836 Body mass index (BMI) 36.0-36.9, adult: Secondary | ICD-10-CM | POA: Insufficient documentation

## 2020-04-25 DIAGNOSIS — R7989 Other specified abnormal findings of blood chemistry: Secondary | ICD-10-CM | POA: Insufficient documentation

## 2020-04-25 DIAGNOSIS — R000 Tachycardia, unspecified: Secondary | ICD-10-CM

## 2020-04-25 NOTE — Patient Instructions (Addendum)
It was great to see you again today.  Please continue to work on healthy diet and exercise.-Diet plan below.  This focuses on low sodium, and hypertension.    Continue to monitor your blood pressure as we discussed.  Next office visit in 3 months and will recheck the blood pressure, routine labs, thyroid lab, lipids, and make decision on whether you need to start a low-dose blood pressure medication.  Continue to follow-up with Dr. Donivan Scull however heart rate advice.  Please submit the blood pressure and heart rate readings that he wants to review.  We can all work together.  DASH Eating Plan DASH stands for "Dietary Approaches to Stop Hypertension." The DASH eating plan is a healthy eating plan that has been shown to reduce high blood pressure (hypertension). It may also reduce your risk for type 2 diabetes, heart disease, and stroke. The DASH eating plan may also help with weight loss. What are tips for following this plan?  General guidelines  Avoid eating more than 2,300 mg (milligrams) of salt (sodium) a day. If you have hypertension, you may need to reduce your sodium intake to 1,500 mg a day.  Limit alcohol intake to no more than 1 drink a day for nonpregnant women and 2 drinks a day for men. One drink equals 12 oz of beer, 5 oz of wine, or 1 oz of hard liquor.  Work with your health care provider to maintain a healthy body weight or to lose weight. Ask what an ideal weight is for you.  Get at least 30 minutes of exercise that causes your heart to beat faster (aerobic exercise) most days of the week. Activities may include walking, swimming, or biking.  Work with your health care provider or diet and nutrition specialist (dietitian) to adjust your eating plan to your individual calorie needs. Reading food labels   Check food labels for the amount of sodium per serving. Choose foods with less than 5 percent of the Daily Value of sodium. Generally, foods with less than 300 mg of sodium  per serving fit into this eating plan.  To find whole grains, look for the word "whole" as the first word in the ingredient list. Shopping  Buy products labeled as "low-sodium" or "no salt added."  Buy fresh foods. Avoid canned foods and premade or frozen meals. Cooking  Avoid adding salt when cooking. Use salt-free seasonings or herbs instead of table salt or sea salt. Check with your health care provider or pharmacist before using salt substitutes.  Do not fry foods. Cook foods using healthy methods such as baking, boiling, grilling, and broiling instead.  Cook with heart-healthy oils, such as olive, canola, soybean, or sunflower oil. Meal planning  Eat a balanced diet that includes: ? 5 or more servings of fruits and vegetables each day. At each meal, try to fill half of your plate with fruits and vegetables. ? Up to 6-8 servings of whole grains each day. ? Less than 6 oz of lean meat, poultry, or fish each day. A 3-oz serving of meat is about the same size as a deck of cards. One egg equals 1 oz. ? 2 servings of low-fat dairy each day. ? A serving of nuts, seeds, or beans 5 times each week. ? Heart-healthy fats. Healthy fats called Omega-3 fatty acids are found in foods such as flaxseeds and coldwater fish, like sardines, salmon, and mackerel.  Limit how much you eat of the following: ? Canned or prepackaged foods. ? Food  that is high in trans fat, such as fried foods. ? Food that is high in saturated fat, such as fatty meat. ? Sweets, desserts, sugary drinks, and other foods with added sugar. ? Full-fat dairy products.  Do not salt foods before eating.  Try to eat at least 2 vegetarian meals each week.  Eat more home-cooked food and less restaurant, buffet, and fast food.  When eating at a restaurant, ask that your food be prepared with less salt or no salt, if possible. What foods are recommended? The items listed may not be a complete list. Talk with your dietitian  about what dietary choices are best for you. Grains Whole-grain or whole-wheat bread. Whole-grain or whole-wheat pasta. Brown rice. [place]. Bulgur. Whole-grain and low-sodium cereals. Pita bread. Low-fat, low-sodium crackers. Whole-wheat flour tortillas. Vegetables Fresh or frozen vegetables (raw, steamed, roasted, or grilled). Low-sodium or reduced-sodium tomato and vegetable juice. Low-sodium or reduced-sodium tomato sauce and tomato paste. Low-sodium or reduced-sodium canned vegetables. Fruits All fresh, dried, or frozen fruit. Canned fruit in natural juice (without added sugar). Meat and other protein foods Skinless chicken or [place]. Ground chicken or [place]. Pork with fat trimmed off. Fish and seafood. Egg whites. Dried beans, peas, or lentils. Unsalted nuts, nut butters, and seeds. Unsalted canned beans. Lean cuts of beef with fat trimmed off. Low-sodium, lean deli meat. Dairy Low-fat (1%) or fat-free (skim) milk. Fat-free, low-fat, or reduced-fat cheeses. Nonfat, low-sodium ricotta or cottage cheese. Low-fat or nonfat yogurt. Low-fat, low-sodium cheese. Fats and oils Soft margarine without trans fats. Vegetable oil. Low-fat, reduced-fat, or light mayonnaise and salad dressings (reduced-sodium). Canola, safflower, olive, soybean, and sunflower oils. Avocado. Seasoning and other foods Herbs. Spices. Seasoning mixes without salt. Unsalted popcorn and pretzels. Fat-free sweets. What foods are not recommended? The items listed may not be a complete list. Talk with your dietitian about what dietary choices are best for you. Grains Baked goods made with fat, such as croissants, muffins, or some breads. Dry pasta or rice meal packs. Vegetables Creamed or fried vegetables. Vegetables in a cheese sauce. Regular canned vegetables (not low-sodium or reduced-sodium). Regular canned tomato sauce and paste (not low-sodium or reduced-sodium). Regular tomato and vegetable juice (not low-sodium or  reduced-sodium). Angie Fava. Olives. Fruits Canned fruit in a light or heavy syrup. Fried fruit. Fruit in cream or butter sauce. Meat and other protein foods Fatty cuts of meat. Ribs. Fried meat. Berniece Salines. Sausage. Bologna and other processed lunch meats. Salami. Fatback. Hotdogs. Bratwurst. Salted nuts and seeds. Canned beans with added salt. Canned or smoked fish. Whole eggs or egg yolks. Chicken or [place] with skin. Dairy Whole or 2% milk, cream, and half-and-half. Whole or full-fat cream cheese. Whole-fat or sweetened yogurt. Full-fat cheese. Nondairy creamers. Whipped toppings. Processed cheese and cheese spreads. Fats and oils Butter. Stick margarine. Lard. Shortening. Ghee. Bacon fat. Tropical oils, such as coconut, palm kernel, or palm oil. Seasoning and other foods Salted popcorn and pretzels. Onion salt, garlic salt, seasoned salt, table salt, and sea salt. Worcestershire sauce. Tartar sauce. Barbecue sauce. Teriyaki sauce. Soy sauce, including reduced-sodium. Steak sauce. Canned and packaged gravies. Fish sauce. Oyster sauce. Cocktail sauce. Horseradish that you find on the shelf. Ketchup. Mustard. Meat flavorings and tenderizers. Bouillon cubes. Hot sauce and Tabasco sauce. Premade or packaged marinades. Premade or packaged taco seasonings. Relishes. Regular salad dressings. Where to find more information:  National Heart, Lung, and [place]: https://wilson-eaton.com/  American Heart Association: www.heart.org Summary  The DASH eating plan is a  healthy eating plan that has been shown to reduce high blood pressure (hypertension). It may also reduce your risk for type 2 diabetes, heart disease, and stroke.  With the DASH eating plan, you should limit salt (sodium) intake to 2,300 mg a day. If you have hypertension, you may need to reduce your sodium intake to 1,500 mg a day.  When on the DASH eating plan, aim to eat more fresh fruits and vegetables, whole grains, lean proteins, low-fat  dairy, and heart-healthy fats.  Work with your health care provider or diet and nutrition specialist (dietitian) to adjust your eating plan to your individual calorie needs. This information is not intended to replace advice given to you by your health care provider. Make sure you discuss any questions you have with your health care provider. Document Revised: 11/13/2017 Document Reviewed: 11/24/2016 Elsevier Patient Education  [place].  Hypertension, Adult High blood pressure (hypertension) is when the force of blood pumping through the arteries is too strong. The arteries are the blood vessels that carry blood from the heart throughout the body. Hypertension forces the heart to work harder to pump blood and may cause arteries to become narrow or stiff. Untreated or uncontrolled hypertension can cause a heart attack, heart failure, a stroke, kidney disease, and other problems. A blood pressure reading consists of a higher number over a lower number. Ideally, your blood pressure should be below 120/80. The first ("top") number is called the systolic pressure. It is a measure of the pressure in your arteries as your heart beats. The second ("bottom") number is called the diastolic pressure. It is a measure of the pressure in your arteries as the heart relaxes. What are the causes? The exact cause of this condition is not known. There are some conditions that result in or are related to high blood pressure. What increases the risk? Some risk factors for high blood pressure are under your control. The following factors may make you more likely to develop this condition:  Smoking.  Having type 2 diabetes mellitus, high cholesterol, or both.  Not getting enough exercise or physical activity.  Being overweight.  Having too much fat, sugar, calories, or salt (sodium) in your diet.  Drinking too much alcohol. Some risk factors for high blood pressure may be difficult or impossible to  change. Some of these factors include:  Having chronic kidney disease.  Having a family history of high blood pressure.  Age. Risk increases with age.  Race. You may be at higher risk if you are African American.  Gender. Men are at higher risk than women before age 61. After age 32, women are at higher risk than men.  Having obstructive sleep apnea.  Stress. What are the signs or symptoms? High blood pressure may not cause symptoms. Very high blood pressure (hypertensive crisis) may cause:  Headache.  Anxiety.  Shortness of breath.  Nosebleed.  Nausea and vomiting.  Vision changes.  Severe chest pain.  Seizures. How is this diagnosed? This condition is diagnosed by measuring your blood pressure while you are seated, with your arm resting on a flat surface, your legs uncrossed, and your feet flat on the floor. The cuff of the blood pressure monitor will be placed directly against the skin of your upper arm at the level of your heart. It should be measured at least twice using the same arm. Certain conditions can cause a difference in blood pressure between your right and left arms. Certain factors can cause  blood pressure readings to be lower or higher than normal for a short period of time:  When your blood pressure is higher when you are in a health care provider's office than when you are at home, this is called white coat hypertension. Most people with this condition do not need medicines.  When your blood pressure is higher at home than when you are in a health care provider's office, this is called masked hypertension. Most people with this condition may need medicines to control blood pressure. If you have a high blood pressure reading during one visit or you have normal blood pressure with other risk factors, you may be asked to:  Return on a different day to have your blood pressure checked again.  Monitor your blood pressure at home for 1 week or longer. If you  are diagnosed with hypertension, you may have other blood or imaging tests to help your health care provider understand your overall risk for other conditions. How is this treated? This condition is treated by making healthy lifestyle changes, such as eating healthy foods, exercising more, and reducing your alcohol intake. Your health care provider may prescribe medicine if lifestyle changes are not enough to get your blood pressure under control, and if:  Your systolic blood pressure is above 130.  Your diastolic blood pressure is above 80. Your personal target blood pressure may vary depending on your medical conditions, your age, and other factors. Follow these instructions at home: Eating and drinking   Eat a diet that is high in fiber and potassium, and low in sodium, added sugar, and fat. An example eating plan is called the DASH (Dietary Approaches to Stop Hypertension) diet. To eat this way: ? Eat plenty of fresh fruits and vegetables. Try to fill one half of your plate at each meal with fruits and vegetables. ? Eat whole grains, such as whole-wheat pasta, brown rice, or whole-grain bread. Fill about one fourth of your plate with whole grains. ? Eat or drink low-fat dairy products, such as skim milk or low-fat yogurt. ? Avoid fatty cuts of meat, processed or cured meats, and poultry with skin. Fill about one fourth of your plate with lean proteins, such as fish, chicken without skin, beans, eggs, or tofu. ? Avoid pre-made and processed foods. These tend to be higher in sodium, added sugar, and fat.  Reduce your daily sodium intake. Most people with hypertension should eat less than 1,500 mg of sodium a day.  Do not drink alcohol if: ? Your health care provider tells you not to drink. ? You are pregnant, may be pregnant, or are planning to become pregnant.  If you drink alcohol: ? Limit how much you use to:  0-1 drink a day for women.  0-2 drinks a day for men. ? Be aware of how  much alcohol is in your drink. In the [place] one drink equals one 12 oz bottle of beer (355 mL), one 5 oz glass of wine (148 mL), or one 1 oz glass of hard liquor (44 mL). Lifestyle   Work with your health care provider to maintain a healthy body weight or to lose weight. Ask what an ideal weight is for you.  Get at least 30 minutes of exercise most days of the week. Activities may include walking, swimming, or biking.  Include exercise to strengthen your muscles (resistance exercise), such as Pilates or lifting weights, as part of your weekly exercise routine. Try to do these types of exercises  for 30 minutes at least 3 days a week.  Do not use any products that contain nicotine or tobacco, such as cigarettes, e-cigarettes, and chewing tobacco. If you need help quitting, ask your health care provider.  Monitor your blood pressure at home as told by your health care provider.  Keep all follow-up visits as told by your health care provider. This is important. Medicines  Take over-the-counter and prescription medicines only as told by your health care provider. Follow directions carefully. Blood pressure medicines must be taken as prescribed.  Do not skip doses of blood pressure medicine. Doing this puts you at risk for problems and can make the medicine less effective.  Ask your health care provider about side effects or reactions to medicines that you should watch for. Contact a health care provider if you:  Think you are having a reaction to a medicine you are taking.  Have headaches that keep coming back (recurring).  Feel dizzy.  Have swelling in your ankles.  Have trouble with your vision. Get help right away if you:  Develop a severe headache or confusion.  Have unusual weakness or numbness.  Feel faint.  Have severe pain in your chest or abdomen.  Vomit repeatedly.  Have trouble breathing. Summary  Hypertension is when the force of blood pumping through your  arteries is too strong. If this condition is not controlled, it may put you at risk for serious complications.  Your personal target blood pressure may vary depending on your medical conditions, your age, and other factors. For most people, a normal blood pressure is less than 120/80.  Hypertension is treated with lifestyle changes, medicines, or a combination of both. Lifestyle changes include losing weight, eating a healthy, low-sodium diet, exercising more, and limiting alcohol. This information is not intended to replace advice given to you by your health care provider. Make sure you discuss any questions you have with your health care provider. Document Revised: 08/11/2018 Document Reviewed: 08/11/2018 Elsevier Patient Education  2020 [place] With Anxiety Anxiety disorders are mental health conditions that cause overwhelming feelings of nervousness or worry. These feelings interfere with daily activities and relationships. Anxiety disorders include:  Generalized anxiety disorder (GAD).  Social anxiety.  Post-traumatic stress disorder (PTSD). When a person has an anxiety disorder, his or her condition can affect others around him or her, such as friends and family members. Friends and family can help by offering support and understanding. What do I need to know about this condition? Anxiety is the mental and physical experience of nervousness or worry that you might feel when you think about a stressful event. Occasional anxiety is normal, but a person with an anxiety disorder becomes preoccupied with this worry. He or she may know that the anxiety is not logical, but knowing this does not relieve the discomfort that he or she feels. Anxiety disorders cause a great deal of distress and prevent someone from having a normal daily life. Someone with an anxiety disorder may:  Experience anxiety that: ? May or may not have a specific trigger. ? Lasts for long periods  of time. ? Causes physical problems over time. ? Is far more intense than normal anticipation. ? Occurs at unpredictable times.  Feel restless or edgy.  Get fatigued easily.  Have trouble focusing.  Have muscle tension.  Have trouble falling asleep or staying asleep.  Be irritable and occasionally have sudden expressions of strong feelings (outbursts).  Have worries that do not  make sense to you. What do I need to know about the treatment options? Anxiety disorders are generally very treatable by mental health providers such as psychologists, psychiatrists, and clinical social workers. Treatment may include one or more of the following:  Psychotherapy, also called talk therapy or counseling. Types of psychotherapy that are used to treat anxiety include: ? Cognitive behavioral therapy (CBT). This type of therapy teaches a person how to recognize unhealthy feelings, thoughts, and behaviors, and how to replace those feelings with positive thoughts and actions. ? Behavior therapy that trains a person to relax and self-soothe. This also involves gradually exposing the person to the cause of the anxiety (progressive exposure therapy). ? Biofeedback. This type of therapy focuses on trying to control certain body functions, like heart rate, to lessen the physical impact of anxiety. ? Mindfulness-based stress reduction training. This uses education, meditation, and yoga to help a person stay focused on the present instead of living in the past or worrying about the future. ? Acceptance and commitment therapy (ACT). This helps a person to focus on acceptance, rather than trying to control every situation. ? Family therapy. This treatment helps family members to communicate and deal with conflict in healthy ways.  Medicine to treat anxiety and help to control certain emotions and behaviors.  Mind-body programs. These programs encourage the person with anxiety to be involved in his or her  treatment and feel empowered. Mind-body programs may include mindfulness-based stress reduction training, yoga, or tai chi. How can I create a safe environment?  For certain types of anxiety, such as PTSD, you may want to: ? Remove alcohol and prescription medicines from your loved one's home, or limit the amount of these substances in the home. This can help to prevent your loved one from abusing alcohol and prescription medicines. ? Remove or lock up guns and other weapons. If you do not have a safe place to keep a gun, local law enforcement may store a gun for you.  Make a written crisis plan. Include important phone numbers, such as the local crisis intervention team. Make sure that: ? The person with anxiety knows about this plan and agrees with it. ? Everyone who has regular contact with the person knows about the plan and knows what to do in an emergency. ? The written plan is easily accessible and can be quickly put into action. How should I care for myself? It is important to find ways to care for your body, mind, and well-being while supporting someone with anxiety.  Try to maintain your normal routines. This can help you remember that your life is about more than your loved one's condition.  Understand what your limits are. Say "no" to requests or events that lead to a schedule that is too busy.  Make time for activities that help you relax, and try to not feel guilty about taking time for yourself.  Spend time with friends and family.  Consider trying meditation and deep breathing exercises to lower your stress. Attend some mind-body classes by yourself or with your loved one.  Get plenty of sleep.  Exercise, even if it is just taking a short walk a few times a week.  If you are struggling emotionally with guilt, fear, or anger, consider working with a therapist. What are some signs that the condition is getting worse? Signs that your loved one's condition may be getting  worse include:  Dramatic mood swings.  Staying away from activities that he or  she used to enjoy.  Drinking more alcohol than normal.  Either seeming tearful or seeming to lack emotion.  Talking about "not feeling right."  Staying away from others (isolating himself or herself). Where to find support Talk about the condition Good communication is the key to supporting your friend or family member. Here are a few things to keep in mind:  Be careful about too much prodding. Try not to overdo reminders to an adult friend or family member about things like taking medicines. Ask how your loved one prefers that you help.  Ask questions and then listen to your loved one's response. Be available if your friend or family member wants to talk, but give your loved one space if he or she does not feel like talking.  Never ignore comments about suicide, and do not try to avoid the subject of suicide. Talking about suicide will not make your loved one want to act on it. You or your loved one can reach out 24 hours a day to get free, private support (on the phone or a live online chat) from a suicide crisis helpline, such as the [place] at 6825664525.  Be encouraging and offer emotional support. This can help to lower stress. Even saying something simple to comfort your loved one may help.  If your loved one is open to it, go with him or her to visits with a counselor or health care provider. Get suggestions directly from your loved one's care providers about when to get help if you are concerned about behavior changes. Privacy laws limit how much a person's health care provider can share with you without your loved one's permission, but if you feel that a situation is an emergency, do not wait to call a health care provider or emergency services. Find support and resources  Consider joining self-help and support groups, not only for your friend or family member, but  also for yourself. People in these peer and family support groups understand what you and your loved one are going through. They can help you feel a sense of hope and connect you with local resources to help you learn more. ? You may also consider family therapy. General support  Make an effort to learn all you can about your loved one's form of anxiety.  Include your loved one in activities. Invite him or her to go for walks and outings.  Help your loved one follow his or her treatment plan as directed by health care providers. This could mean driving him or her to therapy sessions or suggesting ways to cope with stress.  Remember that your support really matters. Social support is a huge benefit for someone who is coping with anxiety. Where to find more information A health care provider may be able to recommend mental health resources that are available online or over the phone. You could start with:  Government sites such as the Substance Abuse and [place] ([employer]): ktimeonline.com  National mental health organizations such as the [employer] [facility] [place] ([place]): www.nami.org Get help right away if:  Your loved one expresses thoughts about harming himself or herself or others.  Your loved one's behavior becomes hard to predict (erratic).  Your loved one shows behavior that does not make sense with the current time, place, or circumstances. These behaviors may include seeing, feeling, tasting, or hearing things that are not real (hallucinations) or having flashbacks. If you ever feel like your loved one  may hurt himself or herself or others, or may have thoughts about taking his or her own life, get help right away. You can go to your nearest emergency department or call:  Your local emergency services (911 in the [place]  A suicide crisis helpline, such as the [place] at 267-163-0005. This is open 24 hours a  day. Summary  People with anxiety disorders experience overwhelming feelings of nervousness or worry. Friends and family can help by offering support and understanding.  Anxiety disorders are generally very treatable by mental health providers. They can be treated with psychotherapy (also known as talk therapy), behavior therapy, medicine, and mind-body programs.  Be compassionate and listen to your loved one. Be available if your friend or family member wants to talk, but give your loved one space if he or she does not feel like talking.  Find ways to care for your own body, mind, and well-being while supporting someone with anxiety. Try to maintain your normal routines and make time to do things that you enjoy. This information is not intended to replace advice given to you by your health care provider. Make sure you discuss any questions you have with your health care provider. Document Revised: 03/24/2019 Document Reviewed: 04/14/2017 Elsevier Patient Education  [place].

## 2020-04-25 NOTE — Progress Notes (Signed)
Established Patient Office Visit  Subjective:  Patient ID: Beth Murphy, female    DOB: 28-Jan-1981  Age: 39 y.o. MRN: [id number]  CC:  Chief Complaint  Patient presents with  . Follow-up    hypertension    HPI Beth Murphy presents for a f/up HTN, sinus tachycardia with high resting heart rate.    Elevated BP- started exercise- saw Dr. Mariah Milling in Cardiology and he found no problem with he EKG. He  advised exercise - elliptical  start 20 min and he wants to see quick drop in her HR in 15 min.  Average weekend HR 88. New Apple watch to keep heart rate data rec wear at night to see what resting HR in sleep suspected in 80's- 70- 81 resting at sleep. Send in readings. Discussed need for beta blocker- stressful days- not recommended. Watching her BP at home to decide if mild HTN or white coat. She will work on diet/get back to exercise and we will look over her readings at next OV. BP repeated here at rest- unchanged 140/90.   Past Medical History:  Diagnosis Date  . Calculus of kidney   . Cervical dysplasia   . Chicken pox   . HTN (hypertension) 04/03/2020  . Kidney stones    During pregnancy  . Pre-eclampsia    with prior pregnancy  . Tachycardia 04/03/2020    Past Surgical History:  Procedure Laterality Date  . LEEP  2000    Family History  Problem Relation Age of Onset  . Hypertension Father   . Heart disease Maternal Grandmother   . Dementia Maternal Grandmother   . Heart disease Maternal Grandfather   . Hypertension Maternal Grandfather   . Heart disease Paternal Grandfather   . Hypertension Paternal Grandfather     Social History   Socioeconomic History  . Marital status: Married    Spouse name: Not on file  . Number of children: Not on file  . Years of education: Not on file  . Highest education level: Not on file  Occupational History  . Not on file  Tobacco Use  . Smoking status: Never Smoker  . Smokeless tobacco: Never Used  Substance  and Sexual Activity  . Alcohol use: Yes    Alcohol/week: 2.0 - 3.0 standard drinks    Types: 2 - 3 Standard drinks or equivalent per week  . Drug use: Not Currently    Comment: marijuana  . Sexual activity: Yes    Partners: Male    Birth control/protection: Condom  Other Topics Concern  . Not on file  Social History Narrative   Married with 2 girls 7-13. 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   Social Determinants of Health   Financial Resource Strain:   . Difficulty of Paying Living Expenses:   Food Insecurity:   . Worried About Programme researcher, broadcasting/film/video in the Last Year:   . Barista in the Last Year:   Transportation Needs:   . Freight forwarder (Medical):   Marland Kitchen Lack of Transportation (Non-Medical):   Physical Activity:   . Days of Exercise per Week:   . Minutes of Exercise per Session:   Stress:   . Feeling of Stress :   Social Connections:   . Frequency of Communication with Friends and Family:   . Frequency of Social Gatherings with Friends and Family:   . Attends Religious Services:   . Active Member of Clubs or Organizations:   .  Attends Archivist Meetings:   Marland Kitchen Marital Status:   Intimate Partner Violence:   . Fear of Current or Ex-Partner:   . Emotionally Abused:   Marland Kitchen Physically Abused:   . Sexually Abused:     Outpatient Medications Prior to Visit  Medication Sig Dispense Refill  . cholecalciferol (VITAMIN D3) 25 MCG (1000 UNIT) tablet Take 1,000 Units by mouth daily.     No facility-administered medications prior to visit.    Allergies  Allergen Reactions  . Clindamycin/Lincomycin Hives  . Penicillins Hives   Review of Systems  Constitutional: Negative.   HENT: Negative.   Eyes: Negative.   Respiratory: Negative for cough and shortness of breath.   Cardiovascular: Negative for chest pain and leg swelling.  Gastrointestinal: Negative for abdominal pain.  Endocrine: Negative.   Genitourinary: Negative.   Musculoskeletal:  Negative.   Allergic/Immunologic: Negative.   Neurological: Negative.   Psychiatric/Behavioral:       Tolerating good stress. New job- second interview and starting on her doctorate.      Objective:    Physical Exam  Constitutional: She is oriented to person, place, and time. She appears well-developed and well-nourished.  HENT:  Head: Normocephalic.  Eyes: Pupils are equal, round, and reactive to light.  Cardiovascular: Regular rhythm and normal heart sounds.  Pulmonary/Chest: Effort normal and breath sounds normal.  Abdominal: Soft.  Musculoskeletal:        General: Normal range of motion.     Cervical back: Normal range of motion.  Neurological: She is alert and oriented to person, place, and time.  Skin: Skin is warm and dry.  Psychiatric: She has a normal mood and affect. Her behavior is normal. Judgment and thought content normal.  Vitals reviewed.   BP 140/90 (BP Location: Left Arm, Patient Position: Sitting, Cuff Size: Small)   Pulse (!) 117   Temp (!) 96.9 F (36.1 C) (Skin)   Ht 5\' 6"  (1.676 m)   Wt 205 lb (93 kg)   LMP 04/08/2020   SpO2 98%   BMI 33.09 kg/m  Wt Readings from Last 3 Encounters:  04/25/20 205 lb (93 kg)  04/24/20 205 lb (93 kg)  04/16/20 209 lb (94.8 kg)    There are no preventive care reminders to display for this patient.  There are no preventive care reminders to display for this patient.  Lab Results  Component Value Date   TSH 4.55 (H) 04/03/2020   Lab Results  Component Value Date   WBC 8.8 04/03/2020   HGB 14.1 04/03/2020   HCT 41.4 04/03/2020   MCV 86.8 04/03/2020   PLT 330.0 04/03/2020   Lab Results  Component Value Date   NA 136 04/03/2020   K 4.0 04/03/2020   CO2 24 04/03/2020   GLUCOSE 110 (H) 04/03/2020   BUN 12 04/03/2020   CREATININE 0.69 04/03/2020   BILITOT 0.4 04/03/2020   ALKPHOS 66 04/03/2020   AST 15 04/03/2020   ALT 15 04/03/2020   PROT 7.9 04/03/2020   ALBUMIN 4.5 04/03/2020   CALCIUM 9.7  04/03/2020   ANIONGAP 10 05/07/2012   GFR 94.00 04/03/2020   Lab Results  Component Value Date   CHOL 181 04/03/2020   Lab Results  Component Value Date   HDL 46.30 04/03/2020   No results found for: [facility] Lab Results  Component Value Date   TRIG 244.0 (H) 04/03/2020   Lab Results  Component Value Date   CHOLHDL 4 04/03/2020   Lab Results  Component Value Date   HGBA1C 5.4 04/03/2020      Assessment & Plan:   Problem List Items Addressed This Visit      Other   Tachycardia - Primary   Elevated BP without diagnosis of hypertension   BMI 33.0-33.9,adult   Low vitamin D level   Elevated TSH     Please continue to work on healthy diet and exercise.-Diet plan below.  This focuses on low sodium, and hypertension.    Continue to monitor your blood pressure as we discussed.  Next office visit in 3 months and will recheck the blood pressure, routine labs, thyroid lab, lipids, and make decision on whether you need to start a low-dose blood pressure medication.  Continue to follow-up with Dr. Windell Hummingbird however heart rate advice.  Please submit the blood pressure and heart rate readings that he wants to review.  We can all work together.   No orders of the defined types were placed in this encounter.   Follow-up: Return in about 3 months (around 07/26/2020).  This visit occurred during the SARS-CoV-2 public health emergency.  Safety protocols were in place, including screening questions prior to the visit, additional usage of staff PPE, and extensive cleaning of exam room while observing appropriate contact time as indicated for disinfecting solutions.    Amedeo Kinsman, NP

## 2020-07-25 ENCOUNTER — Other Ambulatory Visit: Payer: Self-pay

## 2020-07-26 ENCOUNTER — Ambulatory Visit (INDEPENDENT_AMBULATORY_CARE_PROVIDER_SITE_OTHER): Payer: BC Managed Care – PPO | Admitting: Nurse Practitioner

## 2020-07-26 ENCOUNTER — Encounter: Payer: Self-pay | Admitting: Nurse Practitioner

## 2020-07-26 VITALS — BP 130/80 | HR 112 | Temp 98.3°F | Ht 66.0 in | Wt 205.0 lb

## 2020-07-26 DIAGNOSIS — R7989 Other specified abnormal findings of blood chemistry: Secondary | ICD-10-CM

## 2020-07-26 DIAGNOSIS — E559 Vitamin D deficiency, unspecified: Secondary | ICD-10-CM | POA: Diagnosis not present

## 2020-07-26 DIAGNOSIS — F419 Anxiety disorder, unspecified: Secondary | ICD-10-CM

## 2020-07-26 DIAGNOSIS — E538 Deficiency of other specified B group vitamins: Secondary | ICD-10-CM

## 2020-07-26 LAB — VITAMIN D 25 HYDROXY (VIT D DEFICIENCY, FRACTURES): VITD: 27.67 ng/mL — ABNORMAL LOW (ref 30.00–100.00)

## 2020-07-26 LAB — TSH: TSH: 2.61 u[IU]/mL (ref 0.35–4.50)

## 2020-07-26 LAB — B12 AND FOLATE PANEL
Folate: 22.9 ng/mL (ref >5.9)
Vitamin B-12: 198 pg/mL — ABNORMAL LOW (ref 211–911)

## 2020-07-26 NOTE — Progress Notes (Signed)
Established Patient Office Visit  Subjective:  Patient ID: Beth Murphy, female    DOB: [birth year]  Age: 39 y.o. MRN: [id number]  CC:  Chief Complaint  Patient presents with  . Follow-up    hypertension/anxiety    HPI Beth Murphy is a 39 year old female patient with history of nephrolithiasis, cervical dysplasia, anxiety, hypertension, and tachycardia presents for follow-up visit.  Since her last visit, she has been evaluated by cardiology for tachycardia and had her routine GYN evaluation.  HTN: She is monitoring her blood pressures at home.  She is not taking medication yet. BP Readings from Last 3 Encounters:  07/26/20 130/80  04/25/20 140/90  04/24/20 130/82   Resting tachycardia: Evaluated by cardiology and her EKG showed sinus tachycardia rate 128 bpm no significant ST-T wave changes .  Asymptomatic.  No medication prescribed at this time.  She was advised to start exercise program and a careful gradual weight. She has been checking her daily HR and on her Apple watch. Her resting HR is 97-93. She has tried to do meditation and exercise and those days when she took time for personal care, her HR was lower. Her lowest HR 85 and highest HR 99.    Pulse Readings from Last 3 Encounters:  07/26/20 (!) 112  04/25/20 (!) 117  04/16/20 (!) 128   Anxiety: Her anxiety seems to be work stress related.  Since she has been on summer break, anxiety has not been a problem for her recently. 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  That is going very well.  When she sits in a stressful meetings, she can feel her anxiety raise.   BMI 33/obesity: She is working with her husband eating healthier, meal prepping on Sunday, eating more at home.  She is using her my fitness pal as she has had success with weight loss in the past.  She is motivated.  She is also working on elliptical 20 to 40 minutes a day.  Wt Readings from Last  3 Encounters:  07/26/20 205 lb (93 kg)  04/25/20 205 lb (93 kg)  04/24/20 205 lb (93 kg)   Elevated TSH: 4.55 without DX hypothyroidism.  Needs re checked.  Low Vit D: taking 1000 IU daily  Past Medical History:  Diagnosis Date  . Calculus of kidney   . Cervical dysplasia   . Chicken pox   . HTN (hypertension) 04/03/2020  . Kidney stones    During pregnancy  . Pre-eclampsia    with prior pregnancy  . Tachycardia 04/03/2020    Past Surgical History:  Procedure Laterality Date  . LEEP  2000    Family History  Problem Relation Age of Onset  . Hypertension Father   . Heart disease Maternal Grandmother   . Dementia Maternal Grandmother   . Heart disease Maternal Grandfather   . Hypertension Maternal Grandfather   . Heart disease Paternal Grandfather   . Hypertension Paternal Grandfather     Social History   Socioeconomic History  . Marital status: Married    Spouse name: Not on file  . Number of children: Not on file  . Years of education: Not on file  . Highest education level: Not on file  Occupational History  . Not on file  Tobacco Use  . Smoking status: Never Smoker  . Smokeless tobacco: Never Used  Vaping Use  . Vaping Use: Never used  Substance and Sexual Activity  .  Alcohol use: Yes    Alcohol/week: 2.0 - 3.0 standard drinks    Types: 2 - 3 Standard drinks or equivalent per week  . Drug use: Not Currently    Comment: marijuana  . Sexual activity: Yes    Partners: Male    Birth control/protection: Condom  Other Topics Concern  . Not on file  Social History Narrative   Married with 2 girls 7-13. 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   Social Determinants of Health   Financial Resource Strain:   . Difficulty of Paying Living Expenses:   Food Insecurity:   . Worried About Programme researcher, broadcasting/film/video in the Last Year:   . Barista in the Last Year:   Transportation Needs:   . Freight forwarder (Medical):   Marland Kitchen Lack of Transportation  (Non-Medical):   Physical Activity:   . Days of Exercise per Week:   . Minutes of Exercise per Session:   Stress:   . Feeling of Stress :   Social Connections:   . Frequency of Communication with Friends and Family:   . Frequency of Social Gatherings with Friends and Family:   . Attends Religious Services:   . Active Member of Clubs or Organizations:   . Attends Banker Meetings:   Marland Kitchen Marital Status:   Intimate Partner Violence:   . Fear of Current or Ex-Partner:   . Emotionally Abused:   Marland Kitchen Physically Abused:   . Sexually Abused:     Outpatient Medications Prior to Visit  Medication Sig Dispense Refill  . cholecalciferol (VITAMIN D3) 25 MCG (1000 UNIT) tablet Take 1,000 Units by mouth daily.     No facility-administered medications prior to visit.    Allergies  Allergen Reactions  . Clindamycin/Lincomycin Hives  . Penicillins Hives    Review of Systems  Constitutional: Negative.   HENT: Negative.   Eyes: Negative.   Respiratory: Negative for cough and shortness of breath.   Cardiovascular: Negative for chest pain, palpitations and leg swelling.  Gastrointestinal: Negative.   Endocrine: Negative.   Genitourinary: Negative.   Musculoskeletal: Negative.   Skin: Negative.   Neurological: Negative.   Psychiatric/Behavioral: Negative.        No concerns with depression, situational work stress anxiety       Objective:    Physical Exam Vitals reviewed.  Constitutional:      Appearance: She is obese.  HENT:     Head: Normocephalic.  Cardiovascular:     Rate and Rhythm: Tachycardia present.     Pulses: Normal pulses.     Heart sounds: Normal heart sounds. No murmur heard.   Pulmonary:     Effort: Pulmonary effort is normal.     Breath sounds: Normal breath sounds.  Abdominal:     Palpations: Abdomen is soft.     Tenderness: There is no abdominal tenderness.  Musculoskeletal:        General: Normal range of motion.     Cervical back: Normal  range of motion and neck supple.  Skin:    General: Skin is warm and dry.  Neurological:     General: No focal deficit present.     Mental Status: She is alert and oriented to person, place, and time.  Psychiatric:        Mood and Affect: Mood normal.        Behavior: Behavior normal.        Thought Content: Thought content normal.  Judgment: Judgment normal.     BP 130/80 (BP Location: Left Arm, Patient Position: Sitting, Cuff Size: Normal)   Pulse (!) 112   Temp 98.3 F (36.8 C) (Oral)   Ht 5\' 6"  (1.676 m)   Wt 205 lb (93 kg)   SpO2 98%   BMI 33.09 kg/m  Wt Readings from Last 3 Encounters:  07/26/20 205 lb (93 kg)  04/25/20 205 lb (93 kg)  04/24/20 205 lb (93 kg)     Health Maintenance Due  Topic Date Due  . Hepatitis C Screening  Never done    There are no preventive care reminders to display for this patient.  Lab Results  Component Value Date   TSH 2.61 07/26/2020   Lab Results  Component Value Date   WBC 8.8 04/03/2020   HGB 14.1 04/03/2020   HCT 41.4 04/03/2020   MCV 86.8 04/03/2020   PLT 330.0 04/03/2020   Lab Results  Component Value Date   NA 136 04/03/2020   K 4.0 04/03/2020   CO2 24 04/03/2020   GLUCOSE 110 (H) 04/03/2020   BUN 12 04/03/2020   CREATININE 0.69 04/03/2020   BILITOT 0.4 04/03/2020   ALKPHOS 66 04/03/2020   AST 15 04/03/2020   ALT 15 04/03/2020   PROT 7.9 04/03/2020   ALBUMIN 4.5 04/03/2020   CALCIUM 9.7 04/03/2020   ANIONGAP 10 05/07/2012   GFR 94.00 04/03/2020   Lab Results  Component Value Date   CHOL 181 04/03/2020   Lab Results  Component Value Date   HDL 46.30 04/03/2020   [facility]: [facility] Lab Results  Component Value Date   TRIG 244.0 (H) 04/03/2020   Lab Results  Component Value Date   CHOLHDL 4 04/03/2020   Lab Results  Component Value Date   HGBA1C 5.4 04/03/2020      Assessment & Plan:   Problem List Items Addressed This Visit      Other   Low vitamin D level    Relevant Orders   VITAMIN D 25 Hydroxy (Vit-D Deficiency, Fractures) (Completed)   B12 and Folate Panel (Completed)   Elevated TSH - Primary   Relevant Orders   TSH (Completed)   T4, free    Other Visit Diagnoses    Anxiety       Relevant Orders   B12 and Folate Panel (Completed)   Low serum vitamin B12       Relevant Orders   Methylmalonic Acid   Intrinsic Factor Antibodies     She has made several really good lifestyle changes to work on her weight, blood pressure, no oral stress.  We discussed the pros and cons of beginning medication for anxiety or referral to behavioral therapy.  She would like to continue work on her current plan and will follow up in 3 months.She would like to work on lifestyle changes for her blood pressure. If it remains elevated, she would be agreeable to starting medication.   Advised:  Please go to the lab today.  Continue to work on your healthy lifestyle.  Wonderful job!  Please spot check your blood pressure at home and write them down.  Continue to monitor heart rate as well.  Monitor for anxiety when school starts again, let me know if you think you need to have a referral to behavioral health or if you would like to start on a medication that is not habit-forming.     Follow-up office visit in 3 months  No orders  of the defined types were placed in this encounter.   Follow-up: Return in about 3 months (around 10/26/2020).  This visit occurred during the SARS-CoV-2 public health emergency.  Safety protocols were in place, including screening questions prior to the visit, additional usage of staff PPE, and extensive cleaning of exam room while observing appropriate contact time as indicated for disinfecting solutions.    Amedeo Kinsman, NP

## 2020-07-26 NOTE — Patient Instructions (Addendum)
Please go to the lab today.  Continue to work on your healthy lifestyle.  Wonderful job!  Please spot check your blood pressure at home and write them down.  Continue to monitor heart rate as well.  Monitor for anxiety when school starts again, let me know if you think you need to have a referral to behavioral health or if you would like to start on a medication that is not habit-forming.     Follow-up office visit in 3 months  Managing Anxiety, Adult After being diagnosed with an anxiety disorder, you may be relieved to know why you have felt or behaved a certain way. You may also feel overwhelmed about the treatment ahead and what it will mean for your life. With care and support, you can manage this condition and recover from it. How to manage lifestyle changes Managing stress and anxiety  Stress is your body's reaction to life changes and events, both good and bad. Most stress will last just a few hours, but stress can be ongoing and can lead to more than just stress. Although stress can play a major role in anxiety, it is not the same as anxiety. Stress is usually caused by something external, such as a deadline, test, or competition. Stress normally passes after the triggering event has ended.  Anxiety is caused by something internal, such as imagining a terrible outcome or worrying that something will go wrong that will devastate you. Anxiety often does not go away even after the triggering event is over, and it can become long-term (chronic) worry. It is important to understand the differences between stress and anxiety and to manage your stress effectively so that it does not lead to an anxious response. Talk with your health care provider or a counselor to learn more about reducing anxiety and stress. He or she may suggest tension reduction techniques, such as:  Music therapy. This can include creating or listening to music that you enjoy and that inspires you.  Mindfulness-based  meditation. This involves being aware of your normal breaths while not trying to control your breathing. It can be done while sitting or walking.  Centering prayer. This involves focusing on a word, phrase, or sacred image that means something to you and brings you peace.  Deep breathing. To do this, expand your stomach and inhale slowly through your nose. Hold your breath for 3-5 seconds. Then exhale slowly, letting your stomach muscles relax.  Self-talk. This involves identifying thought patterns that lead to anxiety reactions and changing those patterns.  Muscle relaxation. This involves tensing muscles and then relaxing them. Choose a tension reduction technique that suits your lifestyle and personality. These techniques take time and practice. Set aside 5-15 minutes a day to do them. Therapists can offer counseling and training in these techniques. The training to help with anxiety may be covered by some insurance plans. Other things you can do to manage stress and anxiety include:  Keeping a stress/anxiety diary. This can help you learn what triggers your reaction and then learn ways to manage your response.  Thinking about how you react to certain situations. You may not be able to control everything, but you can control your response.  Making time for activities that help you relax and not feeling guilty about spending your time in this way.  Visual imagery and yoga can help you stay calm and relax.  Medicines Medicines can help ease symptoms. Medicines for anxiety include:  Anti-anxiety drugs.  Antidepressants. Medicines are  often used as a primary treatment for anxiety disorder. Medicines will be prescribed by a health care provider. When used together, medicines, psychotherapy, and tension reduction techniques may be the most effective treatment. Relationships Relationships can play a big part in helping you recover. Try to spend more time connecting with trusted friends and  family members. Consider going to couples counseling, taking family education classes, or going to family therapy. Therapy can help you and others better understand your condition. How to recognize changes in your anxiety Everyone responds differently to treatment for anxiety. Recovery from anxiety happens when symptoms decrease and stop interfering with your daily activities at home or work. This may mean that you will start to:  Have better concentration and focus. Worry will interfere less in your daily thinking.  Sleep better.  Be less irritable.  Have more energy.  Have improved memory. It is important to recognize when your condition is getting worse. Contact your health care provider if your symptoms interfere with home or work and you feel like your condition is not improving. Follow these instructions at home: Activity  Exercise. Most adults should do the following: ? Exercise for at least 150 minutes each week. The exercise should increase your heart rate and make you sweat (moderate-intensity exercise). ? Strengthening exercises at least twice a week.  Get the right amount and quality of sleep. Most adults need 7-9 hours of sleep each night. Lifestyle   Eat a healthy diet that includes plenty of vegetables, fruits, whole grains, low-fat dairy products, and lean protein. Do not eat a lot of foods that are high in solid fats, added sugars, or salt.  Make choices that simplify your life.  Do not use any products that contain nicotine or tobacco, such as cigarettes, e-cigarettes, and chewing tobacco. If you need help quitting, ask your health care provider.  Avoid caffeine, alcohol, and certain over-the-counter cold medicines. These may make you feel worse. Ask your pharmacist which medicines to avoid. General instructions  Take over-the-counter and prescription medicines only as told by your health care provider.  Keep all follow-up visits as told by your health care  provider. This is important. Where to find support You can get help and support from these sources:  Self-help groups.  Online and [employer] organizations.  A trusted spiritual leader.  Couples counseling.  Family education classes.  Family therapy. Where to find more information You may find that joining a support group helps you deal with your anxiety. The following sources can help you locate counselors or support groups near you:  Mental Health America: www.mentalhealthamerica.net  Anxiety and Depression Association of [place] (ADAA): ProgramCam.dewww.adaa.org  [employer] on Mental Illness (NAMI): www.nami.org Contact a health care provider if you:  Have a hard time staying focused or finishing daily tasks.  Spend many hours a day feeling worried about everyday life.  Become exhausted by worry.  Start to have headaches, feel tense, or have nausea.  Urinate more than normal.  Have diarrhea. Get help right away if you have:  A racing heart and shortness of breath.  Thoughts of hurting yourself or others. If you ever feel like you may hurt yourself or others, or have thoughts about taking your own life, get help right away. You can go to your nearest emergency department or call:  Your local emergency services (911 in the [place]  A suicide crisis helpline, such as the National Suicide Prevention Lifeline at 539-174-40371-670 296 2108. This is open 24 hours a day. Summary  Taking steps to learn and use tension reduction techniques can help calm you and help prevent triggering an anxiety reaction.  When used together, medicines, psychotherapy, and tension reduction techniques may be the most effective treatment.  Family, friends, and partners can play a big part in helping you recover from an anxiety disorder. This information is not intended to replace advice given to you by your health care provider. Make sure you discuss any questions you have with your health care  provider. Document Revised: 05/03/2019 Document Reviewed: 05/03/2019 Elsevier Patient Education  2020 [employer].  Hypertension, Adult High blood pressure (hypertension) is when the force of blood pumping through the arteries is too strong. The arteries are the blood vessels that carry blood from the heart throughout the body. Hypertension forces the heart to work harder to pump blood and may cause arteries to become narrow or stiff. Untreated or uncontrolled hypertension can cause a heart attack, heart failure, a stroke, kidney disease, and other problems. A blood pressure reading consists of a higher number over a lower number. Ideally, your blood pressure should be below 120/80. The first ("top") number is called the systolic pressure. It is a measure of the pressure in your arteries as your heart beats. The second ("bottom") number is called the diastolic pressure. It is a measure of the pressure in your arteries as the heart relaxes. What are the causes? The exact cause of this condition is not known. There are some conditions that result in or are related to high blood pressure. What increases the risk? Some risk factors for high blood pressure are under your control. The following factors may make you more likely to develop this condition:  Smoking.  Having type 2 diabetes mellitus, high cholesterol, or both.  Not getting enough exercise or physical activity.  Being overweight.  Having too much fat, sugar, calories, or salt (sodium) in your diet.  Drinking too much alcohol. Some risk factors for high blood pressure may be difficult or impossible to change. Some of these factors include:  Having chronic kidney disease.  Having a family history of high blood pressure.  Age. Risk increases with age.  Race. You may be at higher risk if you are African American.  Gender. Men are at higher risk than women before age 29. After age 58, women are at higher risk than men.  Having  obstructive sleep apnea.  Stress. What are the signs or symptoms? High blood pressure may not cause symptoms. Very high blood pressure (hypertensive crisis) may cause:  Headache.  Anxiety.  Shortness of breath.  Nosebleed.  Nausea and vomiting.  Vision changes.  Severe chest pain.  Seizures. How is this diagnosed? This condition is diagnosed by measuring your blood pressure while you are seated, with your arm resting on a flat surface, your legs uncrossed, and your feet flat on the floor. The cuff of the blood pressure monitor will be placed directly against the skin of your upper arm at the level of your heart. It should be measured at least twice using the same arm. Certain conditions can cause a difference in blood pressure between your right and left arms. Certain factors can cause blood pressure readings to be lower or higher than normal for a short period of time:  When your blood pressure is higher when you are in a health care provider's office than when you are at home, this is called white coat hypertension. Most people with this condition do not need medicines.  When your blood pressure is higher at home than when you are in a health care provider's office, this is called masked hypertension. Most people with this condition may need medicines to control blood pressure. If you have a high blood pressure reading during one visit or you have normal blood pressure with other risk factors, you may be asked to:  Return on a different day to have your blood pressure checked again.  Monitor your blood pressure at home for 1 week or longer. If you are diagnosed with hypertension, you may have other blood or imaging tests to help your health care provider understand your overall risk for other conditions. How is this treated? This condition is treated by making healthy lifestyle changes, such as eating healthy foods, exercising more, and reducing your alcohol intake. Your health  care provider may prescribe medicine if lifestyle changes are not enough to get your blood pressure under control, and if:  Your systolic blood pressure is above 130.  Your diastolic blood pressure is above 80. Your personal target blood pressure may vary depending on your medical conditions, your age, and other factors. Follow these instructions at home: Eating and drinking   Eat a diet that is high in fiber and potassium, and low in sodium, added sugar, and fat. An example eating plan is called the DASH (Dietary Approaches to Stop Hypertension) diet. To eat this way: ? Eat plenty of fresh fruits and vegetables. Try to fill one half of your plate at each meal with fruits and vegetables. ? Eat whole grains, such as whole-wheat pasta, brown rice, or whole-grain bread. Fill about one fourth of your plate with whole grains. ? Eat or drink low-fat dairy products, such as skim milk or low-fat yogurt. ? Avoid fatty cuts of meat, processed or cured meats, and poultry with skin. Fill about one fourth of your plate with lean proteins, such as fish, chicken without skin, beans, eggs, or tofu. ? Avoid pre-made and processed foods. These tend to be higher in sodium, added sugar, and fat.  Reduce your daily sodium intake. Most people with hypertension should eat less than 1,500 mg of sodium a day.  Do not drink alcohol if: ? Your health care provider tells you not to drink. ? You are pregnant, may be pregnant, or are planning to become pregnant.  If you drink alcohol: ? Limit how much you use to:  0-1 drink a day for women.  0-2 drinks a day for men. ? Be aware of how much alcohol is in your drink. In the [place] one drink equals one 12 oz bottle of beer (355 mL), one 5 oz glass of wine (148 mL), or one 1 oz glass of hard liquor (44 mL). Lifestyle   Work with your health care provider to maintain a healthy body weight or to lose weight. Ask what an ideal weight is for you.  Get at least 30  minutes of exercise most days of the week. Activities may include walking, swimming, or biking.  Include exercise to strengthen your muscles (resistance exercise), such as Pilates or lifting weights, as part of your weekly exercise routine. Try to do these types of exercises for 30 minutes at least 3 days a week.  Do not use any products that contain nicotine or tobacco, such as cigarettes, e-cigarettes, and chewing tobacco. If you need help quitting, ask your health care provider.  Monitor your blood pressure at home as told by your health care provider.  Keep all  follow-up visits as told by your health care provider. This is important. Medicines  Take over-the-counter and prescription medicines only as told by your health care provider. Follow directions carefully. Blood pressure medicines must be taken as prescribed.  Do not skip doses of blood pressure medicine. Doing this puts you at risk for problems and can make the medicine less effective.  Ask your health care provider about side effects or reactions to medicines that you should watch for. Contact a health care provider if you:  Think you are having a reaction to a medicine you are taking.  Have headaches that keep coming back (recurring).  Feel dizzy.  Have swelling in your ankles.  Have trouble with your vision. Get help right away if you:  Develop a severe headache or confusion.  Have unusual weakness or numbness.  Feel faint.  Have severe pain in your chest or abdomen.  Vomit repeatedly.  Have trouble breathing. Summary  Hypertension is when the force of blood pumping through your arteries is too strong. If this condition is not controlled, it may put you at risk for serious complications.  Your personal target blood pressure may vary depending on your medical conditions, your age, and other factors. For most people, a normal blood pressure is less than 120/80.  Hypertension is treated with lifestyle  changes, medicines, or a combination of both. Lifestyle changes include losing weight, eating a healthy, low-sodium diet, exercising more, and limiting alcohol. This information is not intended to replace advice given to you by your health care provider. Make sure you discuss any questions you have with your health care provider. Document Revised: 08/11/2018 Document Reviewed: 08/11/2018 Elsevier Patient Education  2020 [employer].

## 2020-08-20 ENCOUNTER — Other Ambulatory Visit: Payer: Self-pay | Admitting: Obstetrics & Gynecology

## 2020-08-22 ENCOUNTER — Telehealth: Payer: Self-pay

## 2020-08-22 NOTE — Telephone Encounter (Signed)
Pt states she will call and schedule her mammogram right now.

## 2020-08-22 NOTE — Telephone Encounter (Signed)
-----   Message from Nadara Mustard, MD sent at 08/20/2020 10:58 AM EDT ----- Regarding: MMG Received notice she has not received MMG yet as ordered at her Annual. Please check and encourage her to do this, and document conversation.

## 2020-09-10 ENCOUNTER — Ambulatory Visit
Admission: RE | Admit: 2020-09-10 | Discharge: 2020-09-10 | Disposition: A | Discharge status: Home / Self Care | Payer: BC Managed Care – PPO | Source: Ambulatory Visit | Attending: Obstetrics & Gynecology | Admitting: Obstetrics & Gynecology

## 2020-09-10 ENCOUNTER — Other Ambulatory Visit: Payer: Self-pay

## 2020-09-10 DIAGNOSIS — Z1231 Encounter for screening mammogram for malignant neoplasm of breast: Secondary | ICD-10-CM | POA: Diagnosis not present

## 2020-10-26 ENCOUNTER — Ambulatory Visit: Payer: BC Managed Care – PPO | Admitting: Nurse Practitioner

## 2020-10-26 DIAGNOSIS — Z0289 Encounter for other administrative examinations: Secondary | ICD-10-CM

## 2021-04-25 IMAGING — MG DIGITAL SCREENING BILAT W/ TOMO W/ CAD
6 of 10 series · 6 of 30 positions shown · non-contrast
Comparison: Previous exam(s).

CLINICAL DATA: Screening.

EXAM:
DIGITAL SCREENING BILATERAL MAMMOGRAM WITH TOMO AND CAD

[R MLO synth-2D (1 of 2)]
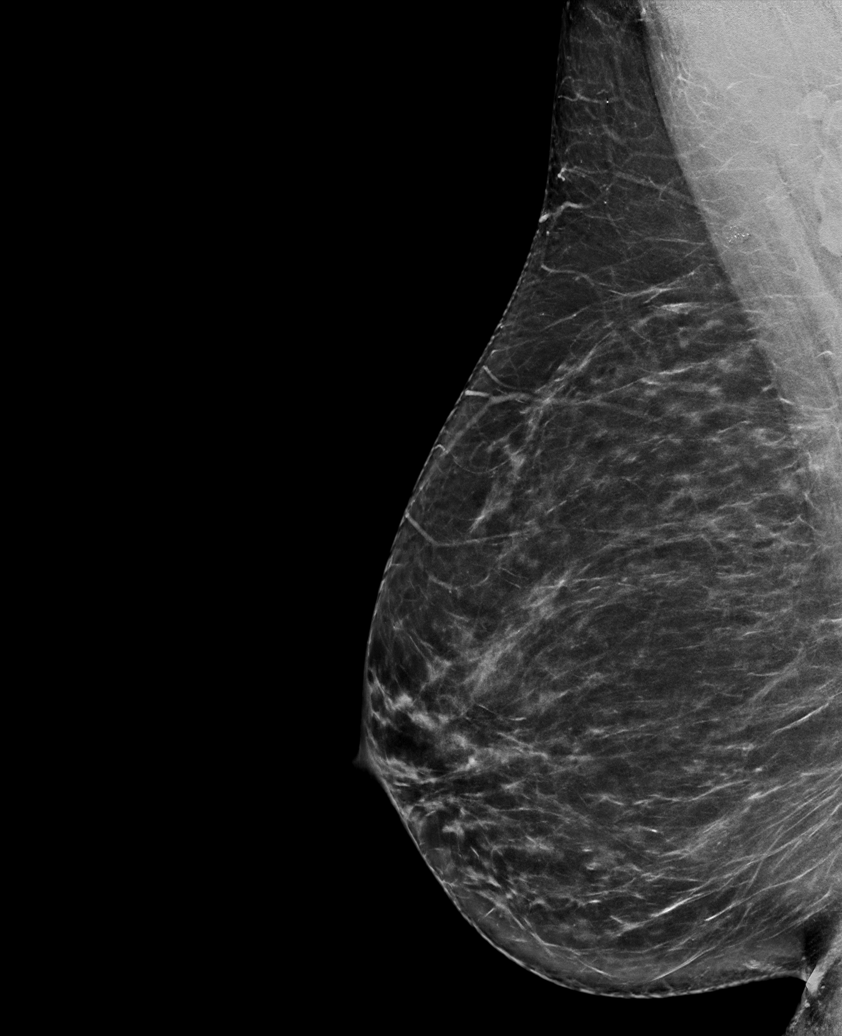

[R MLO synth-2D (2 of 2)]
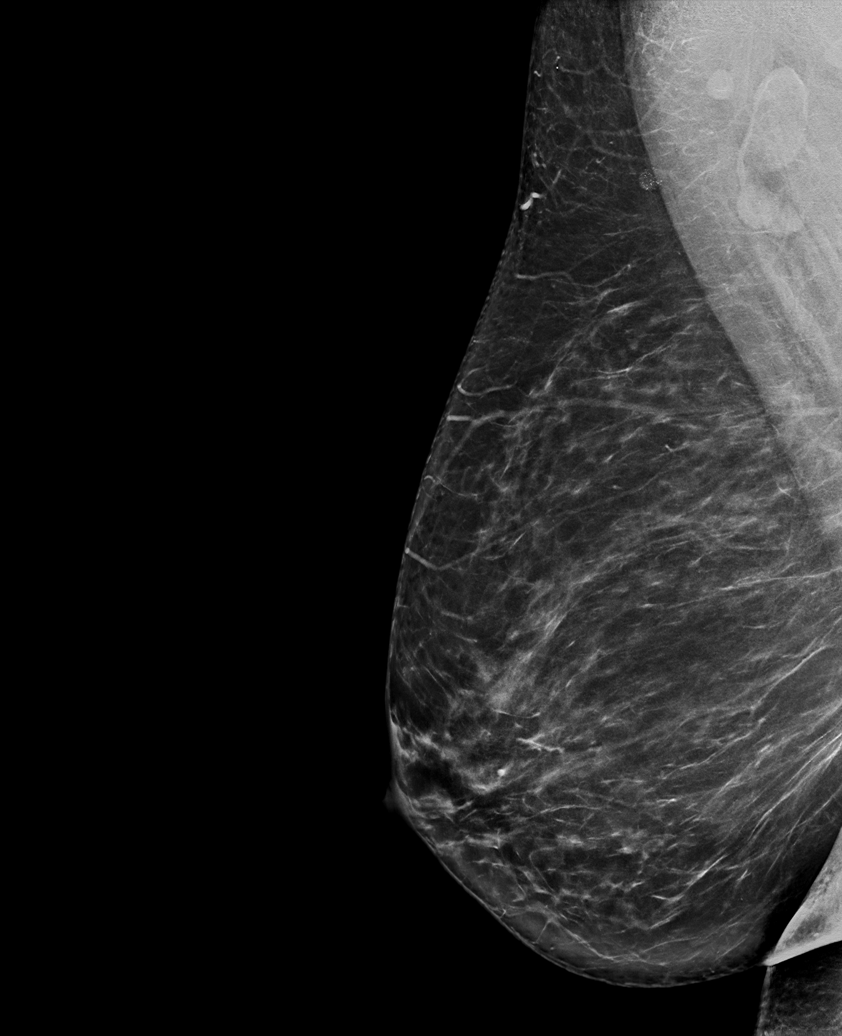

[R CC synth-2D]
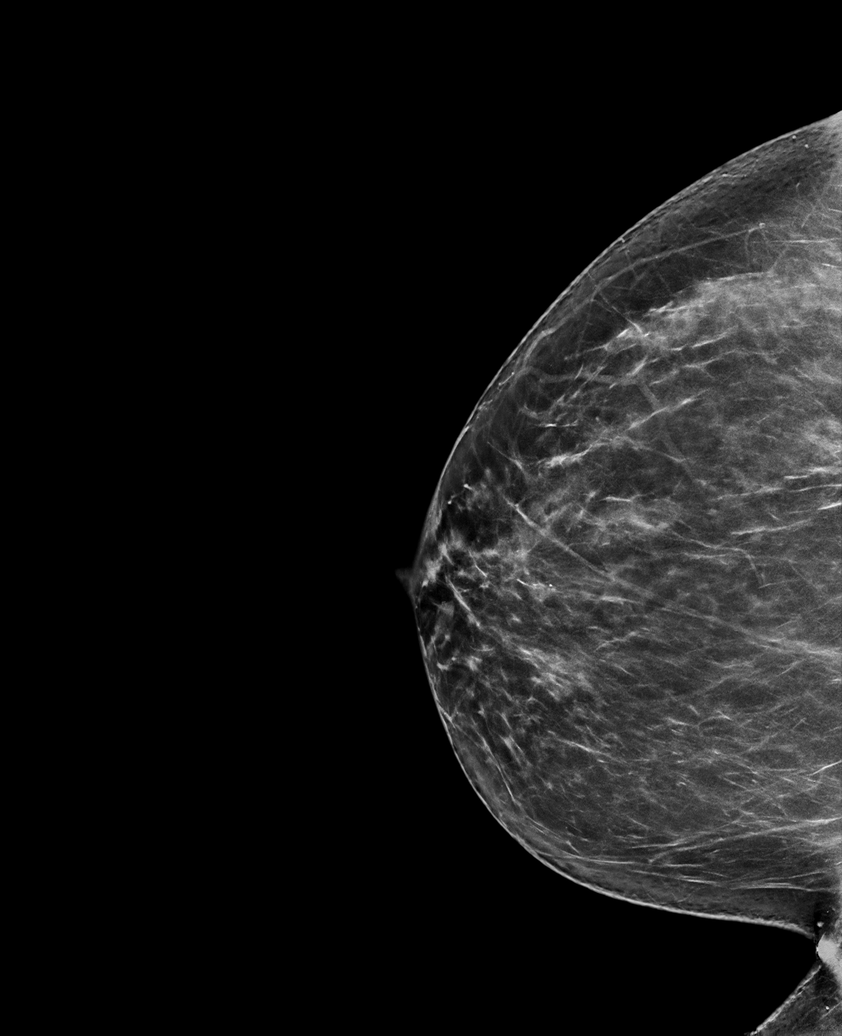

[L CC synth-2D]
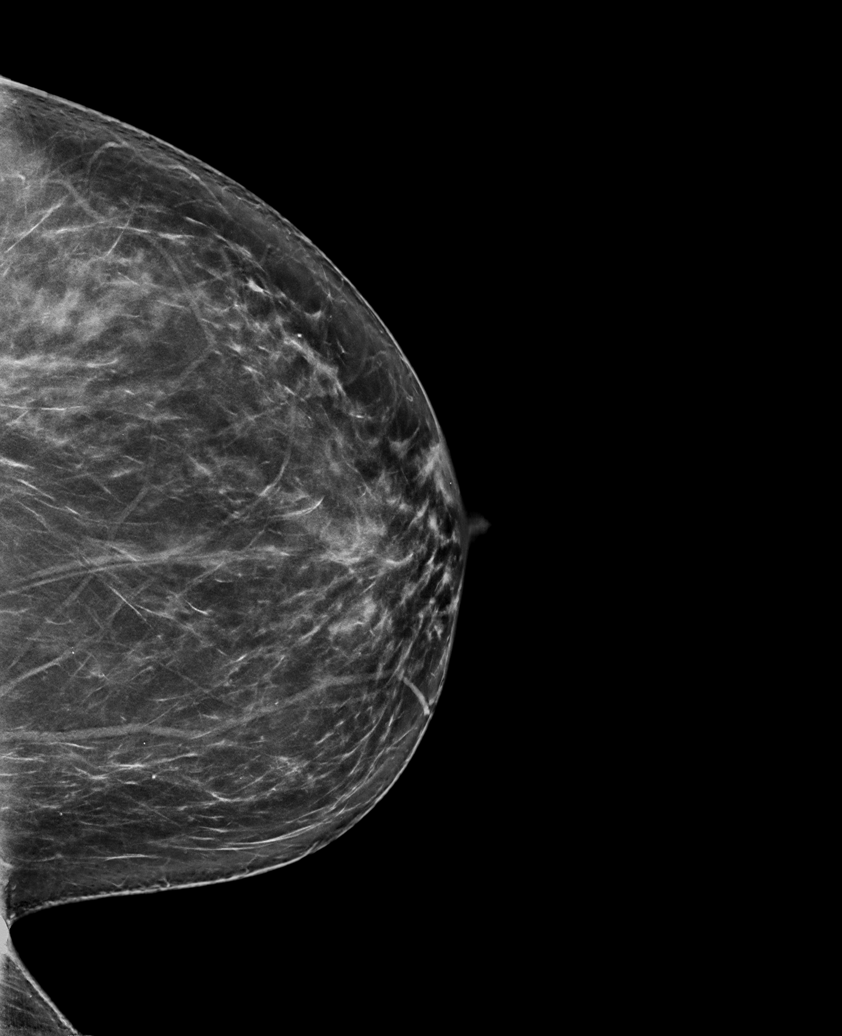

[L MLO synth-2D]
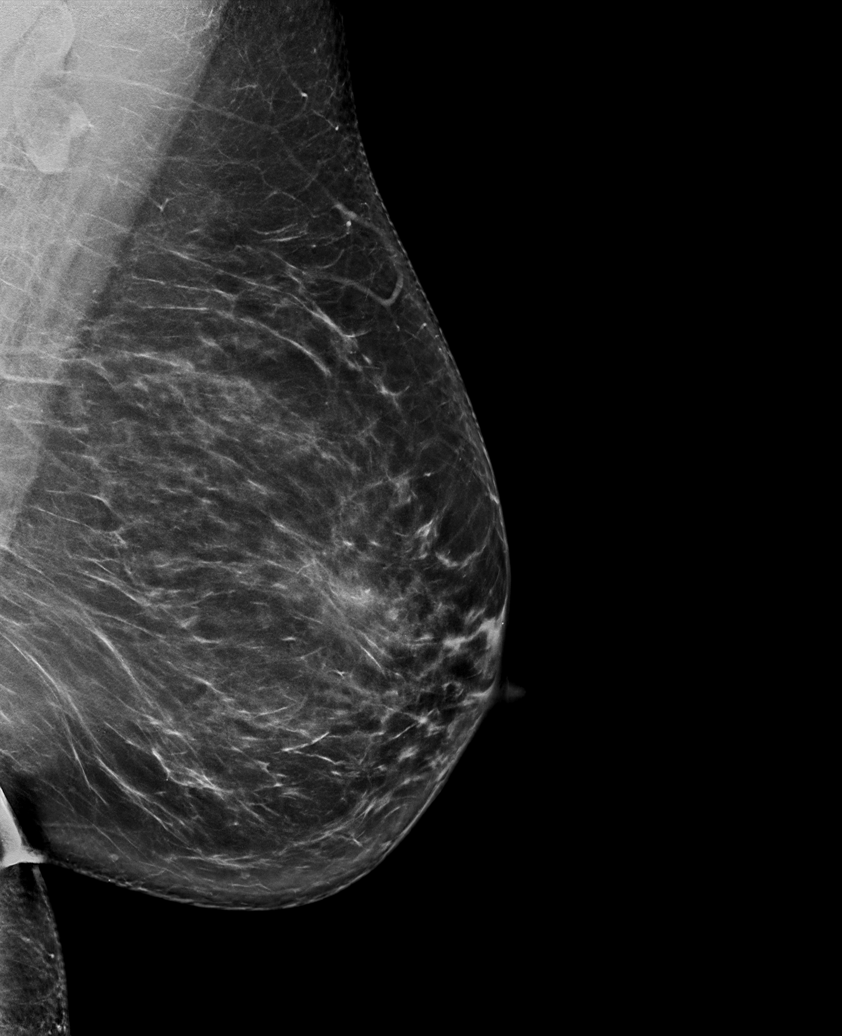

[R MLO tomo · tomo slice 43/84.0]
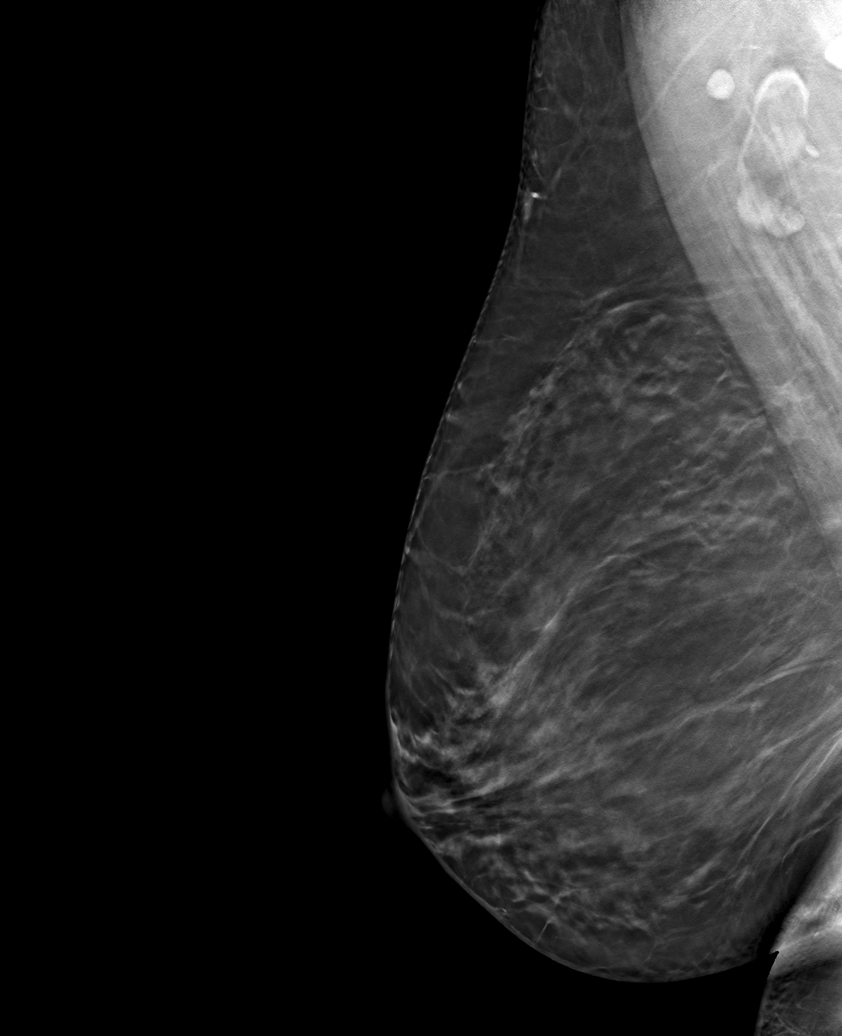

[6 of 30 positions shown; findings below may reference images not displayed]

ACR Breast Density Category b: There are scattered areas of
fibroglandular density.
FINDINGS: There are no findings suspicious for malignancy. Images were
processed with CAD.
IMPRESSION: No mammographic evidence of malignancy. A result letter of this
screening mammogram will be mailed directly to the patient.

RECOMMENDATION:
Screening mammogram in one year. (Code:[id number])

BI-RADS CATEGORY  1: Negative.

## 2021-10-16 ENCOUNTER — Telehealth: Payer: Self-pay

## 2021-10-16 ENCOUNTER — Other Ambulatory Visit: Payer: Self-pay | Admitting: Obstetrics & Gynecology

## 2021-10-16 DIAGNOSIS — Z1231 Encounter for screening mammogram for malignant neoplasm of breast: Secondary | ICD-10-CM

## 2021-10-16 NOTE — Telephone Encounter (Signed)
Order has been placed.

## 2021-10-16 NOTE — Telephone Encounter (Signed)
Pt calling ;has annual scheduled c PH 11/27/21; can order for mammogram at [facility] be put in so she can schedule it?  [phone]

## 2021-10-17 NOTE — Telephone Encounter (Signed)
Left detailed msg order is in.

## 2021-10-29 ENCOUNTER — Other Ambulatory Visit: Payer: Self-pay

## 2021-10-29 ENCOUNTER — Ambulatory Visit
Admission: RE | Admit: 2021-10-29 | Discharge: 2021-10-29 | Disposition: A | Discharge status: Home / Self Care | Payer: BC Managed Care – PPO | Source: Ambulatory Visit | Attending: Obstetrics & Gynecology | Admitting: Obstetrics & Gynecology

## 2021-10-29 DIAGNOSIS — Z1231 Encounter for screening mammogram for malignant neoplasm of breast: Secondary | ICD-10-CM | POA: Insufficient documentation

## 2021-11-27 ENCOUNTER — Other Ambulatory Visit (HOSPITAL_COMMUNITY)
Admission: RE | Admit: 2021-11-27 | Discharge: 2021-11-27 | Disposition: A | Discharge status: Home / Self Care | Payer: BC Managed Care – PPO | Source: Ambulatory Visit | Attending: Obstetrics & Gynecology | Admitting: Obstetrics & Gynecology

## 2021-11-27 ENCOUNTER — Ambulatory Visit (INDEPENDENT_AMBULATORY_CARE_PROVIDER_SITE_OTHER): Payer: BC Managed Care – PPO | Admitting: Obstetrics & Gynecology

## 2021-11-27 ENCOUNTER — Other Ambulatory Visit: Payer: Self-pay

## 2021-11-27 ENCOUNTER — Encounter: Payer: Self-pay | Admitting: Obstetrics & Gynecology

## 2021-11-27 VITALS — BP 142/90 | Ht 66.0 in | Wt 215.0 lb

## 2021-11-27 DIAGNOSIS — Z124 Encounter for screening for malignant neoplasm of cervix: Secondary | ICD-10-CM

## 2021-11-27 DIAGNOSIS — Z01419 Encounter for gynecological examination (general) (routine) without abnormal findings: Secondary | ICD-10-CM | POA: Diagnosis not present

## 2021-11-27 NOTE — Progress Notes (Signed)
HPI:      [name] is a 40 y.o. [id number] who LMP was Patient's last menstrual period was 11/08/2021., she presents today for her annual examination. The patient has no complaints today. The patient is sexually active. Her last pap: approximate date 2019 and was normal and last mammogram: approximate date 10/2021 and was normal. The patient does perform self breast exams.  There is no notable family history of breast or ovarian cancer in her family.  The patient has regular exercise: yes.  The patient denies current symptoms of depression.    GYN History: Contraception: condoms  PMHx: Past Medical History:  Diagnosis Date   Calculus of kidney    Cervical dysplasia    Chicken pox    HTN (hypertension) 04/03/2020   Kidney stones    During pregnancy   Pre-eclampsia    with prior pregnancy   Tachycardia 04/03/2020   Past Surgical History:  Procedure Laterality Date   LEEP  2000   Family History  Problem Relation Age of Onset   Hypertension Father    Heart disease Maternal Grandmother    Dementia Maternal Grandmother    Heart disease Maternal Grandfather    Hypertension Maternal Grandfather    Heart disease Paternal Grandfather    Hypertension Paternal Grandfather    Breast cancer Neg Hx    Social History   Tobacco Use   Smoking status: Never   Smokeless tobacco: Never  Vaping Use   Vaping Use: Never used  Substance Use Topics   Alcohol use: Yes    Alcohol/week: 2.0 - 3.0 standard drinks    Types: 2 - 3 Standard drinks or equivalent per week   Drug use: Not Currently    Comment: marijuana    Current Outpatient Medications:    cholecalciferol (VITAMIN D3) 25 MCG (1000 UNIT) tablet, Take 1,000 Units by mouth daily., Disp: , Rfl:  Allergies: Clindamycin/lincomycin and Penicillins  Review of Systems  Constitutional:  Negative for chills, fever and malaise/fatigue.  HENT:  Negative for congestion, sinus pain and sore throat.   Eyes:  Negative for blurred  vision and pain.  Respiratory:  Negative for cough and wheezing.   Cardiovascular:  Negative for chest pain and leg swelling.  Gastrointestinal:  Negative for abdominal pain, constipation, diarrhea, heartburn, nausea and vomiting.  Genitourinary:  Negative for dysuria, frequency, hematuria and urgency.  Musculoskeletal:  Negative for back pain, joint pain, myalgias and neck pain.  Skin:  Negative for itching and rash.  Neurological:  Negative for dizziness, tremors and weakness.  Endo/Heme/Allergies:  Does not bruise/bleed easily.  Psychiatric/Behavioral:  Negative for depression. The patient is not nervous/anxious and does not have insomnia.    Objective: BP (!) 142/90    Ht 5\' 6"  (1.676 m)    Wt 215 lb (97.5 kg)    LMP 11/08/2021    BMI 34.70 kg/m   Filed Weights   11/27/21 0910  Weight: 215 lb (97.5 kg)   Body mass index is 34.7 kg/m. Physical Exam Constitutional:      General: She is not in acute distress.    Appearance: She is well-developed.  Genitourinary:     Bladder, rectum and urethral meatus normal.     No lesions in the vagina.     Right Labia: No rash, tenderness or lesions.    Left Labia: No tenderness, lesions or rash.    No vaginal bleeding.      Right Adnexa: not tender and no mass present.  Left Adnexa: not tender and no mass present.    No cervical motion tenderness, friability, lesion or polyp.     Uterus is not enlarged.     No uterine mass detected.    Pelvic exam was performed with patient in the lithotomy position.  Breasts:    Right: No mass, skin change or tenderness.     Left: No mass, skin change or tenderness.  HENT:     Head: Normocephalic and atraumatic. No laceration.     Right Ear: Hearing normal.     Left Ear: Hearing normal.     Mouth/Throat:     Pharynx: Uvula midline.  Eyes:     Pupils: Pupils are equal, round, and reactive to light.  Neck:     Thyroid: No thyromegaly.  Cardiovascular:     Rate and Rhythm: Normal rate and  regular rhythm.     Heart sounds: No murmur heard.   No friction rub. No gallop.  Pulmonary:     Effort: Pulmonary effort is normal. No respiratory distress.     Breath sounds: Normal breath sounds. No wheezing.  Abdominal:     General: Bowel sounds are normal. There is no distension.     Palpations: Abdomen is soft.     Tenderness: There is no abdominal tenderness. There is no rebound.  Musculoskeletal:        General: Normal range of motion.     Cervical back: Normal range of motion and neck supple.  Neurological:     Mental Status: She is alert and oriented to person, place, and time.     Cranial Nerves: No cranial nerve deficit.  Skin:    General: Skin is warm and dry.  Psychiatric:        Judgment: Judgment normal.  Vitals reviewed.    Assessment:  ANNUAL EXAM 1. Women's annual routine gynecological examination   2. Screening for cervical cancer      Screening Plan:            1.  Cervical Screening-  Pap smear done today  2. Breast screening- Exam annually and mammogram>40 planned   3. Colonoscopy every 10 years after age 24, Hemoccult testing - after age 83  80. Labs managed by PCP  5. Counseling for contraception: condoms  Upstream - 11/27/21 0913       Pregnancy Intention Screening   Does the patient want to become pregnant in the next year? No    Does the patient's partner want to become pregnant in the next year? No    Would the patient like to discuss contraceptive options today? No      Contraception Wrap Up   Current Method No Method - Other Reason    End Method No Method - Other Reason    Contraception Counseling Provided No            The pregnancy intention screening data noted above was reviewed. Potential methods of contraception were discussed. The patient elected to proceed with Female Condom.    6. White coat HTN, pt has h/o elevated BPs at doctors office exams, as it is today Counseled as to risks of elevated blood pressures and long  term effects    F/U  Return in about 1 year (around 11/27/2022) for Annual.  Annamarie Major, MD, [facility], [facility] 11/27/2021  9:32 AM

## 2021-11-27 NOTE — Patient Instructions (Signed)
PAP every three years Mammogram every year    Normal in Nov at [facility] every 10 years, after age 42 Labs yearly (with PCP)  Thank you for choosing [facility]. As part of our ongoing efforts to improve patient experience, we would appreciate your feedback. Please fill out the short survey that you will receive by mail or MyChart. Your opinion is important to [place]! - Dr. Tiburcio Pea  Recommendations to boost your immunity to prevent illness such as viral flu and colds, including covid19, are as follows:       - - -  Vitamin K2 and Vitamin D3  - - - Take Vitamin K2 at 200-300 mcg daily (usually 2-3 pills daily of the over the counter formulation). Take Vitamin D3 at 3000-4000 U daily (usually 3-4 pills daily of the over the counter formulation). Studies show that these two at high normal levels in your system are very effective in keeping your immunity so strong and protective that you will be unlikely to contract viral illness such as those listed above.  Dr Tiburcio Pea

## 2021-11-29 LAB — CYTOLOGY - PAP
Comment: NEGATIVE
Diagnosis: NEGATIVE
High risk HPV: NEGATIVE

## 2023-01-08 ENCOUNTER — Telehealth: Payer: Self-pay

## 2023-01-08 DIAGNOSIS — Z1231 Encounter for screening mammogram for malignant neoplasm of breast: Secondary | ICD-10-CM

## 2023-01-08 NOTE — Telephone Encounter (Signed)
Pt calling; has schedule annual appt and needs orders for mammogram.  [phone]

## 2023-01-08 NOTE — Telephone Encounter (Signed)
Pt aware via detailed msg. 

## 2023-01-08 NOTE — Telephone Encounter (Signed)
Mammo order placed, pt can schedule mammo now.

## 2023-02-11 ENCOUNTER — Ambulatory Visit
Admission: RE | Admit: 2023-02-11 | Discharge: 2023-02-11 | Disposition: A | Discharge status: Home / Self Care | Payer: BC Managed Care – PPO | Source: Ambulatory Visit | Attending: Obstetrics and Gynecology | Admitting: Obstetrics and Gynecology

## 2023-02-11 DIAGNOSIS — Z1231 Encounter for screening mammogram for malignant neoplasm of breast: Secondary | ICD-10-CM

## 2023-02-16 NOTE — Progress Notes (Unsigned)
PCP:  Marval Regal, NP   No chief complaint on file.    HPI:      [name] is a 42 y.o. [id number] whose LMP was Patient's last menstrual period was 01/25/2023., presents today for her annual examination.  Her menses are {norm/abn:715}, lasting {number: 22536} days.  Dysmenorrhea {dysmen:716}. She {does:18564} have intermenstrual bleeding.  Sex activity: {sex active: 315163}.  Last Pap: 11/27/21  Results were: no abnormalities /neg HPV DNA  Hx of STDs: {STD hx:14358}  Last mammogram: 02/11/23 Results were: normal--routine follow-up in 12 months There is no FH of breast cancer. There is no FH of ovarian cancer. The patient {does:18564} do self-breast exams.  Tobacco use: {tob:20664} Alcohol use: {Alcohol:11675} No drug use.  Exercise: {exercise:31265}  She {does:18564} get adequate calcium and Vitamin D in her diet.  Patient Active Problem List   Diagnosis Date Noted   Elevated BP without diagnosis of hypertension 04/25/2020   BMI 33.0-33.9,adult 04/25/2020   Low vitamin D level 04/25/2020   Elevated TSH 04/25/2020   Tachycardia 04/03/2020   HTN (hypertension) 04/03/2020   Preventative health care 09/18/2015   Panic disorder 09/18/2015    Past Surgical History:  Procedure Laterality Date   LEEP  2000    Family History  Problem Relation Age of Onset   Hypertension Father    Heart disease Maternal Grandmother    Dementia Maternal Grandmother    Heart disease Maternal Grandfather    Hypertension Maternal Grandfather    Heart disease Paternal Grandfather    Hypertension Paternal Grandfather    Breast cancer Neg Hx     Social History   Socioeconomic History   Marital status: Married    Spouse name: Not on file   Number of children: Not on file   Years of education: Not on file   Highest education level: Not on file  Occupational History   Not on file  Tobacco Use   Smoking status: Never   Smokeless tobacco: Never  Vaping Use   Vaping  Use: Never used  Substance and Sexual Activity   Alcohol use: Yes    Alcohol/week: 2.0 - 3.0 standard drinks of alcohol    Types: 2 - 3 Standard drinks or equivalent per week   Drug use: Not Currently    Comment: marijuana   Sexual activity: Yes    Partners: Male    Birth control/protection: Condom  Other Topics Concern   Not on file  Social History Narrative   Married with 2 girls 7-13. 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   Social Determinants of Health   Financial Resource Strain: Not on file  Food Insecurity: Not on file  Transportation Needs: Not on file  Physical Activity: Not on file  Stress: Not on file  Social Connections: Not on file  Intimate Partner Violence: Not on file     Current Outpatient Medications:    cholecalciferol (VITAMIN D3) 25 MCG (1000 UNIT) tablet, Take 1,000 Units by mouth daily., Disp: , Rfl:      ROS:  Review of Systems BREAST: No symptoms   Objective: LMP 01/25/2023    OBGyn Exam  Results: No results found for this or any previous visit (from the past 24 hour(s)).  Assessment/Plan: No diagnosis found.  No orders of the defined types were placed in this encounter.            GYN counsel {counseling: 16159}     F/U  No follow-ups on file.  Merrilee Ancona B. Boneta Standre, PA-C 02/16/2023 9:40 PM

## 2023-02-17 ENCOUNTER — Encounter: Payer: Self-pay | Admitting: Obstetrics and Gynecology

## 2023-02-17 ENCOUNTER — Ambulatory Visit (INDEPENDENT_AMBULATORY_CARE_PROVIDER_SITE_OTHER): Payer: BC Managed Care – PPO | Admitting: Obstetrics and Gynecology

## 2023-02-17 VITALS — BP 124/80 | Ht 67.0 in | Wt 224.0 lb

## 2023-02-17 DIAGNOSIS — Z1231 Encounter for screening mammogram for malignant neoplasm of breast: Secondary | ICD-10-CM

## 2023-02-17 DIAGNOSIS — Z01419 Encounter for gynecological examination (general) (routine) without abnormal findings: Secondary | ICD-10-CM

## 2023-02-17 NOTE — Patient Instructions (Signed)
I value your feedback and you entrusting us with your care. If you get a [facility] patient survey, I would appreciate you taking the time to let us know about your experience today. Thank you! ? ? ?

## 2023-03-27 ENCOUNTER — Ambulatory Visit: Payer: BC Managed Care – PPO | Admitting: Nurse Practitioner

## 2023-03-31 NOTE — Progress Notes (Signed)
Beth Dicker, NP-C Phone: [phone]  Beth Murphy is a 42 y.o. female who presents today to establish care and for annual exam. She has no complaints or new concerns today.   Tachycardia- She has been evaluated by Cardiology in the past who cleared her. She monitors her pulse closely. Resting heart rate is usually in the mid 80s - low 90s. Reports her heart rate gets up to 120s-130s with working out. Denies chest pain. Denies palpitations. Denies dizziness.   HYPERTENSION Disease Monitoring Home BP Monitoring- Not checking Chest pain- No    Dyspnea- No Medications Compliance-  None. Lightheadedness-  No  Edema- No BMET    Component Value Date/Time   NA 136 04/03/2020 1558   NA 140 05/07/2012 1725   K 4.0 04/03/2020 1558   K 4.1 05/07/2012 1725   CL 101 04/03/2020 1558   CL 106 05/07/2012 1725   CO2 24 04/03/2020 1558   CO2 24 05/07/2012 1725   GLUCOSE 110 (H) 04/03/2020 1558   GLUCOSE 83 05/07/2012 1725   BUN 12 04/03/2020 1558   BUN 12 05/07/2012 1725   CREATININE 0.69 04/03/2020 1558   CREATININE 0.74 05/07/2012 1725   CALCIUM 9.7 04/03/2020 1558   CALCIUM 8.7 05/07/2012 1725   GFRNONAA >60 05/07/2012 1725   GFRAA >60 05/07/2012 1725    Diet: Well rounded- vegetables and protein, not a lot of sweets, avoids caffeine Exercise: Elliptical for 20-25 minutes 4 times a week Pap smear: 11/27/2021 Mammogram: 02/11/2023 Family history-  Colon cancer: No  Breast cancer: No  Ovarian cancer: No Menses: 03/20/2023 Sexually active: Yes Vaccines-   Flu: 2023- UTD  Tetanus: 09/25/2009- Due  COVID19: x 5 HIV screening: Negative Hep C Screening: Deferred Tobacco use: No Alcohol use: Yes- on the weekends Illicit Drug use: No Dentist: Yes Ophthalmology: Yes   Active Ambulatory Problems    Diagnosis Date Noted   Preventative health care 09/18/2015   Panic disorder 09/18/2015   Tachycardia 04/03/2020   HTN (hypertension) 04/03/2020   Elevated BP without  diagnosis of hypertension 04/25/2020   BMI 33.0-33.9,adult 04/25/2020   Low vitamin D level 04/25/2020   Elevated TSH 04/25/2020   Low vitamin B12 level 04/01/2023   Resolved Ambulatory Problems    Diagnosis Date Noted   No Resolved Ambulatory Problems   Past Medical History:  Diagnosis Date   Calculus of kidney    Cervical dysplasia    Chicken pox    Kidney stones    Pre-eclampsia     Family History  Problem Relation Age of Onset   Hypertension Father    Heart disease Maternal Grandmother    Dementia Maternal Grandmother    Heart disease Maternal Grandfather    Hypertension Maternal Grandfather    Heart disease Paternal Grandfather    Hypertension Paternal Grandfather    Breast cancer Neg Hx     Social History   Socioeconomic History   Marital status: Married    Spouse name: Not on file   Number of children: Not on file   Years of education: Not on file   Highest education level: Not on file  Occupational History   Not on file  Tobacco Use   Smoking status: Never   Smokeless tobacco: Never  Vaping Use   Vaping Use: Never used  Substance and Sexual Activity   Alcohol use: Yes    Alcohol/week: 2.0 - 3.0 standard drinks of alcohol    Types: 2 - 3 Standard drinks or equivalent per week  Drug use: Not Currently    Comment: marijuana   Sexual activity: Yes    Partners: Male    Birth control/protection: Condom, None  Other Topics Concern   Not on file  Social History Narrative   Married with 2 girls 7-13. 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   Social Determinants of Health   Financial Resource Strain: Not on file  Food Insecurity: Not on file  Transportation Needs: Not on file  Physical Activity: Not on file  Stress: Not on file  Social Connections: Not on file  Intimate Partner Violence: Not on file    ROS  General:  Negative for unexplained weight loss, fever Skin: Negative for new or changing mole, sore that won't heal HEENT:  Negative for trouble hearing, trouble seeing, ringing in ears, mouth sores, hoarseness, change in voice, dysphagia. CV:  Negative for chest pain, dyspnea, edema, palpitations Resp: Negative for cough, dyspnea, hemoptysis GI: Negative for nausea, vomiting, diarrhea, constipation, abdominal pain, melena, hematochezia. GU: Negative for dysuria, incontinence, urinary hesitance, hematuria, vaginal or penile discharge, polyuria, sexual difficulty, lumps in testicle or breasts MSK: Negative for muscle cramps or aches, joint pain or swelling Neuro: Negative for headaches, weakness, numbness, dizziness, passing out/fainting Psych: Negative for depression, anxiety, memory problems  Objective  Physical Exam Vitals:   04/01/23 1005 04/01/23 1038  BP: 136/88 126/78  Pulse: (!) 124 (!) 113  Temp: 98.8 F (37.1 C)   SpO2: 99%     BP Readings from Last 3 Encounters:  04/01/23 126/78  02/17/23 124/80  11/27/21 (!) 142/90   Wt Readings from Last 3 Encounters:  04/01/23 223 lb 12.8 oz (101.5 kg)  02/17/23 224 lb (101.6 kg)  11/27/21 215 lb (97.5 kg)    Physical Exam Constitutional:      General: She is not in acute distress.    Appearance: Normal appearance.  HENT:     Head: Normocephalic.     Right Ear: Tympanic membrane normal.     Left Ear: Tympanic membrane normal.     Nose: Nose normal.     Mouth/Throat:     Mouth: Mucous membranes are moist.     Pharynx: Oropharynx is clear.  Eyes:     Conjunctiva/sclera: Conjunctivae normal.     Pupils: Pupils are equal, round, and reactive to light.  Neck:     Thyroid: No thyromegaly.  Cardiovascular:     Rate and Rhythm: Regular rhythm. Tachycardia present.     Heart sounds: Normal heart sounds.  Pulmonary:     Effort: Pulmonary effort is normal.     Breath sounds: Normal breath sounds.  Abdominal:     General: Abdomen is flat. Bowel sounds are normal.     Palpations: Abdomen is soft. There is no mass.     Tenderness: There is no  abdominal tenderness.  Musculoskeletal:        General: Normal range of motion.  Lymphadenopathy:     Cervical: No cervical adenopathy.  Skin:    General: Skin is warm and dry.     Findings: No rash.  Neurological:     General: No focal deficit present.     Mental Status: She is alert.  Psychiatric:        Mood and Affect: Mood normal.        Behavior: Behavior normal.    Assessment/Plan:   Preventative health care Assessment & Plan: Physical exam complete. Lab work as outlined. Will contact patient with results. Pap- UTD. Mammo-  UTD. Flu and COVID vaccines- UTD. Tetanus vaccine due, given in office today. Recommended annual follow ups with Dentist and Ophthalmology. Encouraged to continue healthy diet and exercise. Return to care in 6 months for follow up, sooner PRN.   Orders: -     CBC with Differential/Platelet -     Comprehensive metabolic panel -     Hemoglobin A1c  Primary hypertension Assessment & Plan: Chronic. Patient is not on medication. BP stable in office today. Last 3 readings- 124/80, 126/78 and 136/88. Encouraged patient to start checking her blood pressure at home and keep a log to bring at next appointment. Will continue to monitor and consider starting patient on beta blocker to also help with her tachycardia.    Tachycardia Assessment & Plan: Chronic. Evaluated by Cardiology in 2021, no concerns at that time, cleared. Patient monitors her pulses closely- resting usually mid 80s to low 90s. Asymptomatic. Will continue to monitor and refer back to Cardiology if changing or worsening. Will consider adding beta blocker if BP staying elevated.   Orders: -     CBC with Differential/Platelet  Low vitamin D level Assessment & Plan: Chronic. Has OTC daily supplement at home that she has not been taking. Will check vitamin D level today.   Orders: -     VITAMIN D 25 Hydroxy (Vit-D Deficiency, Fractures)  Low vitamin B12 level Assessment & Plan: Chronic. Has  OTC daily supplement at home that she has not been taking. Will check B12 level today.   Orders: -     Vitamin B12  Elevated TSH Assessment & Plan: Elevated x 1 in 2021, then returned to normal. Will check TSH today.   Orders: -     TSH  Lipid screening -     Lipid panel  Need for Tdap vaccination -     Tdap vaccine greater than or equal to 7yo IM    Return in about 6 months (around 10/01/2023) for Follow up.   Beth Dicker, NP-C [facility] - [employer]

## 2023-04-01 ENCOUNTER — Encounter: Payer: Self-pay | Admitting: Nurse Practitioner

## 2023-04-01 ENCOUNTER — Ambulatory Visit: Payer: BC Managed Care – PPO | Admitting: Nurse Practitioner

## 2023-04-01 VITALS — BP 126/78 | HR 113 | Temp 98.8°F | Ht 66.0 in | Wt 223.8 lb

## 2023-04-01 DIAGNOSIS — Z0000 Encounter for general adult medical examination without abnormal findings: Secondary | ICD-10-CM | POA: Diagnosis not present

## 2023-04-01 DIAGNOSIS — Z13220 Encounter for screening for lipoid disorders: Secondary | ICD-10-CM

## 2023-04-01 DIAGNOSIS — R000 Tachycardia, unspecified: Secondary | ICD-10-CM

## 2023-04-01 DIAGNOSIS — R7989 Other specified abnormal findings of blood chemistry: Secondary | ICD-10-CM

## 2023-04-01 DIAGNOSIS — I10 Essential (primary) hypertension: Secondary | ICD-10-CM | POA: Diagnosis not present

## 2023-04-01 DIAGNOSIS — Z23 Encounter for immunization: Secondary | ICD-10-CM

## 2023-04-01 LAB — CBC WITH DIFFERENTIAL/PLATELET
Basophils Absolute: 0.1 10*3/uL (ref 0.0–0.1)
Basophils Relative: 0.6 % (ref 0.0–3.0)
Eosinophils Absolute: 0.1 10*3/uL (ref 0.0–0.7)
Eosinophils Relative: 0.7 % (ref 0.0–5.0)
HCT: 40.4 % (ref 36.0–46.0)
Hemoglobin: 13.7 g/dL (ref 12.0–15.0)
Lymphocytes Relative: 17.2 % (ref 12.0–46.0)
Lymphs Abs: 1.5 10*3/uL (ref 0.7–4.0)
MCHC: 33.9 g/dL (ref 30.0–36.0)
MCV: 84 fl (ref 78.0–100.0)
Monocytes Absolute: 0.4 10*3/uL (ref 0.1–1.0)
Monocytes Relative: 4.7 % (ref 3.0–12.0)
Neutro Abs: 6.7 10*3/uL (ref 1.4–7.7)
Neutrophils Relative %: 76.8 % (ref 43.0–77.0)
Platelets: 348 10*3/uL (ref 150.0–400.0)
RBC: 4.81 Mil/uL (ref 3.87–5.11)
RDW: 14.1 % (ref 11.5–15.5)
WBC: 8.7 10*3/uL (ref 4.0–10.5)

## 2023-04-01 LAB — LIPID PANEL
Cholesterol: 157 mg/dL (ref 0–200)
HDL: 46.5 mg/dL (ref >39.00)
NonHDL: 110.47
Total CHOL/HDL Ratio: 3
Triglycerides: 323 mg/dL — ABNORMAL HIGH (ref 0.0–149.0)
VLDL: 64.6 mg/dL — ABNORMAL HIGH (ref 0.0–40.0)

## 2023-04-01 LAB — COMPREHENSIVE METABOLIC PANEL
ALT: 20 U/L (ref 0–35)
AST: 19 U/L (ref 0–37)
Albumin: 4.3 g/dL (ref 3.5–5.2)
Alkaline Phosphatase: 67 U/L (ref 39–117)
BUN: 9 mg/dL (ref 6–23)
CO2: 26 mEq/L (ref 19–32)
Calcium: 9.2 mg/dL (ref 8.4–10.5)
Chloride: 100 mEq/L (ref 96–112)
Creatinine, Ser: 0.7 mg/dL (ref 0.40–1.20)
GFR: 105.89 mL/min (ref >60.00)
Glucose, Bld: 94 mg/dL (ref 70–99)
Potassium: 4.1 mEq/L (ref 3.5–5.1)
Sodium: 137 mEq/L (ref 135–145)
Total Bilirubin: 0.4 mg/dL (ref 0.2–1.2)
Total Protein: 7.3 g/dL (ref 6.0–8.3)

## 2023-04-01 LAB — HEMOGLOBIN A1C: Hgb A1c MFr Bld: 5.8 % (ref 4.6–6.5)

## 2023-04-01 LAB — LDL CHOLESTEROL, DIRECT: Direct LDL: 85 mg/dL

## 2023-04-01 LAB — VITAMIN B12: Vitamin B-12: 160 pg/mL — ABNORMAL LOW (ref 211–911)

## 2023-04-01 LAB — VITAMIN D 25 HYDROXY (VIT D DEFICIENCY, FRACTURES): VITD: 14.37 ng/mL — ABNORMAL LOW (ref 30.00–100.00)

## 2023-04-01 LAB — TSH: TSH: 3.15 u[IU]/mL (ref 0.35–5.50)

## 2023-04-01 NOTE — Assessment & Plan Note (Addendum)
Chronic. Patient is not on medication. BP stable in office today. Last 3 readings- 124/80, 126/78 and 136/88. Encouraged patient to start checking her blood pressure at home and keep a log to bring at next appointment. Will continue to monitor and consider starting patient on beta blocker to also help with her tachycardia.

## 2023-04-01 NOTE — Assessment & Plan Note (Addendum)
Elevated x 1 in 2021, then returned to normal. Will check TSH today.

## 2023-04-01 NOTE — Assessment & Plan Note (Addendum)
Chronic. Evaluated by Cardiology in 2021, no concerns at that time, cleared. Patient monitors her pulses closely- resting usually mid 80s to low 90s. Asymptomatic. Will continue to monitor and refer back to Cardiology if changing or worsening. Will consider adding beta blocker if BP staying elevated.

## 2023-04-01 NOTE — Assessment & Plan Note (Signed)
Physical exam complete. Lab work as outlined. Will contact patient with results. Pap- UTD. Mammo- UTD. Flu and COVID vaccines- UTD. Tetanus vaccine due, given in office today. Recommended annual follow ups with Dentist and Ophthalmology. Encouraged to continue healthy diet and exercise. Return to care in 6 months for follow up, sooner PRN.

## 2023-04-01 NOTE — Assessment & Plan Note (Signed)
Chronic. Has OTC daily supplement at home that she has not been taking. Will check vitamin D level today.

## 2023-04-01 NOTE — Assessment & Plan Note (Addendum)
Chronic. Has OTC daily supplement at home that she has not been taking. Will check B12 level today.

## 2023-10-01 ENCOUNTER — Ambulatory Visit: Payer: BC Managed Care – PPO | Admitting: Nurse Practitioner

## 2023-12-30 ENCOUNTER — Ambulatory Visit: Payer: BC Managed Care – PPO | Admitting: Nurse Practitioner

## 2024-04-05 ENCOUNTER — Encounter: Payer: Self-pay | Admitting: Nurse Practitioner

## 2024-04-05 ENCOUNTER — Ambulatory Visit (INDEPENDENT_AMBULATORY_CARE_PROVIDER_SITE_OTHER): Payer: BC Managed Care – PPO | Admitting: Nurse Practitioner

## 2024-04-05 VITALS — BP 146/98 | HR 113 | Temp 97.6°F | Ht 66.0 in | Wt 226.8 lb

## 2024-04-05 DIAGNOSIS — I10 Essential (primary) hypertension: Secondary | ICD-10-CM | POA: Diagnosis not present

## 2024-04-05 DIAGNOSIS — F410 Panic disorder [episodic paroxysmal anxiety] without agoraphobia: Secondary | ICD-10-CM

## 2024-04-05 DIAGNOSIS — R7989 Other specified abnormal findings of blood chemistry: Secondary | ICD-10-CM

## 2024-04-05 DIAGNOSIS — Z1231 Encounter for screening mammogram for malignant neoplasm of breast: Secondary | ICD-10-CM

## 2024-04-05 DIAGNOSIS — Z13220 Encounter for screening for lipoid disorders: Secondary | ICD-10-CM

## 2024-04-05 DIAGNOSIS — R000 Tachycardia, unspecified: Secondary | ICD-10-CM

## 2024-04-05 DIAGNOSIS — Z6836 Body mass index (BMI) 36.0-36.9, adult: Secondary | ICD-10-CM | POA: Diagnosis not present

## 2024-04-05 LAB — LIPID PANEL
Cholesterol: 155 mg/dL (ref 0–200)
HDL: 41.2 mg/dL (ref >39.00)
LDL Cholesterol: 46 mg/dL (ref 0–99)
NonHDL: 113.55
Total CHOL/HDL Ratio: 4
Triglycerides: 337 mg/dL — ABNORMAL HIGH (ref 0.0–149.0)
VLDL: 67.4 mg/dL — ABNORMAL HIGH (ref 0.0–40.0)

## 2024-04-05 LAB — CBC WITH DIFFERENTIAL/PLATELET
Basophils Absolute: 0 10*3/uL (ref 0.0–0.1)
Basophils Relative: 0.5 % (ref 0.0–3.0)
Eosinophils Absolute: 0.1 10*3/uL (ref 0.0–0.7)
Eosinophils Relative: 1.1 % (ref 0.0–5.0)
HCT: 39.1 % (ref 36.0–46.0)
Hemoglobin: 13.5 g/dL (ref 12.0–15.0)
Lymphocytes Relative: 16.2 % (ref 12.0–46.0)
Lymphs Abs: 1.2 10*3/uL (ref 0.7–4.0)
MCHC: 34.4 g/dL (ref 30.0–36.0)
MCV: 84.5 fl (ref 78.0–100.0)
Monocytes Absolute: 0.3 10*3/uL (ref 0.1–1.0)
Monocytes Relative: 3.6 % (ref 3.0–12.0)
Neutro Abs: 5.8 10*3/uL (ref 1.4–7.7)
Neutrophils Relative %: 78.6 % — ABNORMAL HIGH (ref 43.0–77.0)
Platelets: 321 10*3/uL (ref 150.0–400.0)
RBC: 4.63 Mil/uL (ref 3.87–5.11)
RDW: 13.9 % (ref 11.5–15.5)
WBC: 7.3 10*3/uL (ref 4.0–10.5)

## 2024-04-05 LAB — COMPREHENSIVE METABOLIC PANEL WITH GFR
ALT: 18 U/L (ref 0–35)
AST: 16 U/L (ref 0–37)
Albumin: 4.2 g/dL (ref 3.5–5.2)
Alkaline Phosphatase: 61 U/L (ref 39–117)
BUN: 8 mg/dL (ref 6–23)
CO2: 26 meq/L (ref 19–32)
Calcium: 9.3 mg/dL (ref 8.4–10.5)
Chloride: 100 meq/L (ref 96–112)
Creatinine, Ser: 0.69 mg/dL (ref 0.40–1.20)
GFR: 105.51 mL/min (ref >60.00)
Glucose, Bld: 177 mg/dL — ABNORMAL HIGH (ref 70–99)
Potassium: 4.6 meq/L (ref 3.5–5.1)
Sodium: 136 meq/L (ref 135–145)
Total Bilirubin: 0.4 mg/dL (ref 0.2–1.2)
Total Protein: 7.3 g/dL (ref 6.0–8.3)

## 2024-04-05 LAB — VITAMIN D 25 HYDROXY (VIT D DEFICIENCY, FRACTURES): VITD: 20.27 ng/mL — ABNORMAL LOW (ref 30.00–100.00)

## 2024-04-05 LAB — HEMOGLOBIN A1C: Hgb A1c MFr Bld: 6 % (ref 4.6–6.5)

## 2024-04-05 LAB — TSH: TSH: 3.23 u[IU]/mL (ref 0.35–5.50)

## 2024-04-05 LAB — VITAMIN B12: Vitamin B-12: 431 pg/mL (ref 211–911)

## 2024-04-05 MED ORDER — METOPROLOL SUCCINATE ER 25 MG PO TB24
25.0000 mg | ORAL_TABLET | Freq: Every day | ORAL | 3 refills | Status: DC
Start: 2024-04-05 — End: 2024-04-25

## 2024-04-05 NOTE — Progress Notes (Addendum)
 Bluford Burkitt, NP-C Phone: [phone]  Beth Murphy is a 43 y.o. female who presents today for tachycardia and hypertension.   Discussed the use of AI scribe software for clinical note transcription with the patient, who gave verbal consent to proceed.  History of Present Illness   Beth Murphy is a 43 year old female who presents with elevated heart rate and blood pressure.  She monitors her heart rate at home, which averages between 93 to 98 bpm during workdays and mid-80s on weekends. She has not been monitoring her blood pressure at home but noted it was elevated during a recent Mohs surgery, which she attributes to anxiety about the procedure.  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 She describes feeling 'worked up' during intense meetings and experiences panic attacks occasionally, particularly at 3 AM, which disrupts her sleep. She can usually fall asleep easily but wakes up in the middle of the night due to anxiety.  No chest pain, shortness of breath, dizziness, headaches, or heart palpitations, although she sometimes feels her heart beating during panic attacks. She does not feel her heart skipping beats and does not experience these symptoms daily.  She has a history of elevated thyroid  levels and reports thinning hair but no issues with nails or temperature intolerance. She is currently taking Vitamin D  and Vitamin B supplements.  Her social history includes a busy lifestyle with commitments to her children's activities, which impacts her diet and exercise routine. She and her husband plan to incorporate more physical activity into their routine.      Social History   Tobacco Use  Smoking Status Never  Smokeless Tobacco Never    Current Outpatient Medications on File Prior to Visit  Medication Sig Dispense Refill    b complex vitamins capsule Take 1 capsule by mouth daily.     cholecalciferol (VITAMIN D3) 25 MCG (1000 UNIT) tablet Take 1,000 Units by mouth daily.     No current facility-administered medications on file prior to visit.     ROS see history of present illness  Objective  Physical Exam Vitals:   04/05/24 0805 04/05/24 0825  BP: (!) 150/96 (!) 146/98  Pulse: (!) 119 (!) 113  Temp: 97.6 F (36.4 C)   SpO2: 99%     BP Readings from Last 3 Encounters:  04/05/24 (!) 146/98  04/01/23 126/78  02/17/23 124/80   Wt Readings from Last 3 Encounters:  04/05/24 226 lb 12.8 oz (102.9 kg)  04/01/23 223 lb 12.8 oz (101.5 kg)  02/17/23 224 lb (101.6 kg)    Physical Exam Constitutional:      General: She is not in acute distress.    Appearance: Normal appearance.  HENT:     Head: Normocephalic.  Cardiovascular:     Rate and Rhythm: Regular rhythm. Tachycardia present.     Heart sounds: Normal heart sounds.  Pulmonary:     Effort: Pulmonary effort is normal.     Breath sounds: Normal breath sounds.  Skin:    General: Skin is warm and dry.  Neurological:     General: No focal deficit present.     Mental Status: She is alert.  Psychiatric:        Mood and Affect: Mood normal.        Behavior: Behavior normal.      Assessment/Plan: Please see individual problem list.  Primary  hypertension Assessment & Plan: Blood pressure is elevated x 2 readings today, likely due to increased stress, with previous readings normal. Start Toprol  XL 25 mg daily. Check blood pressure at home daily after rest. Schedule a nurse visit for a blood pressure check in two weeks. Lab work as outlined. Advised decreased sodium intake. Encourage healthy diet and regular exercise.   Orders: -     Metoprolol  Succinate ER; Take 1 tablet (25 mg total) by mouth daily.  Dispense: 90 tablet; Refill: 3 -     CBC with Differential/Platelet -     Comprehensive metabolic panel with  GFR  Tachycardia Assessment & Plan: Heart rate is in the 80s-90s, higher during work, without palpitations or arrhythmias. BP also elevated. Start Toprol  XL 25 mg daily. Monitor heart rate at home. Schedule a nurse visit for a heart rate check in two weeks.  Orders: -     Metoprolol  Succinate ER; Take 1 tablet (25 mg total) by mouth daily.  Dispense: 90 tablet; Refill: 3  Panic disorder Assessment & Plan: Intermittent work-related anxiety affects heart rate. Continuous medication is not needed. Encourage stress management techniques and lifestyle modifications.   Elevated TSH Assessment & Plan: Previous elevated TSH with hair thinning, but no temperature intolerance. Check thyroid  function as part of routine labs.  Orders: -     TSH  Low vitamin D  level -     VITAMIN D  25 Hydroxy (Vit-D Deficiency, Fractures)  Low vitamin B12 level -     Vitamin B12  BMI 36.0-36.9,adult -     Hemoglobin A1c  Lipid screening -     Lipid panel  Screening mammogram for breast cancer -     3D Screening Mammogram, Left and Right; Future    Return in about 2 weeks (around 04/19/2024) for Blood pressure check with nursing then 3 month follow up.   Bluford Burkitt, NP-C [facility] - [facility]

## 2024-04-06 ENCOUNTER — Encounter: Payer: Self-pay | Admitting: Nurse Practitioner

## 2024-04-06 NOTE — Assessment & Plan Note (Signed)
 Intermittent work-related anxiety affects heart rate. Continuous medication is not needed. Encourage stress management techniques and lifestyle modifications.

## 2024-04-06 NOTE — Patient Instructions (Signed)
 YOUR MAMMOGRAM IS DUE, PLEASE CALL AND GET THIS SCHEDULED! [facility] - call [phone]

## 2024-04-06 NOTE — Assessment & Plan Note (Signed)
 Previous elevated TSH with hair thinning, but no temperature intolerance. Check thyroid  function as part of routine labs.

## 2024-04-06 NOTE — Assessment & Plan Note (Addendum)
 Blood pressure is elevated x 2 readings today, likely due to increased stress, with previous readings normal. Start Toprol  XL 25 mg daily. Check blood pressure at home daily after rest. Schedule a nurse visit for a blood pressure check in two weeks. Lab work as outlined. Advised decreased sodium intake. Encourage healthy diet and regular exercise.

## 2024-04-06 NOTE — Assessment & Plan Note (Signed)
 Heart rate is in the 80s-90s, higher during work, without palpitations or arrhythmias. BP also elevated. Start Toprol  XL 25 mg daily. Monitor heart rate at home. Schedule a nurse visit for a heart rate check in two weeks.

## 2024-04-06 NOTE — Addendum Note (Signed)
 Addended by: Bluford Burkitt on: 04/06/2024 04:52 PM   Modules accepted: Orders

## 2024-04-25 ENCOUNTER — Other Ambulatory Visit: Payer: Self-pay | Admitting: Nurse Practitioner

## 2024-04-25 ENCOUNTER — Ambulatory Visit

## 2024-04-25 DIAGNOSIS — R000 Tachycardia, unspecified: Secondary | ICD-10-CM

## 2024-04-25 DIAGNOSIS — I10 Essential (primary) hypertension: Secondary | ICD-10-CM

## 2024-04-25 MED ORDER — METOPROLOL SUCCINATE ER 50 MG PO TB24
50.0000 mg | ORAL_TABLET | Freq: Every day | ORAL | 3 refills | Status: AC
Start: 2024-04-25 — End: ?

## 2024-04-25 NOTE — Progress Notes (Signed)
 Patient here for nurse visit BP check per order from Bluford Burkitt NP.   Patient reports compliance with prescribed BP medications: yes, pt stated that she is taking bp meds at bed time. Pt states she has been monitoring her pulse and she is averaging around the 90 range. Pt wanted to know if she should change the way she is taking to see if her pulse will be lower during the day. Advise pt I would include her question in her chart   Last dose of BP medication: last night before bed  BP Readings from Last 3 Encounters:  04/05/24 (!) 146/98  04/01/23 126/78  02/17/23 124/80   Pulse Readings from Last 3 Encounters:  04/05/24 (!) 113  04/01/23 (!) 113  07/26/20 (!) 112    Per Bluford Burkitt NP, double metoprolol  at bed time. New prescription sent in. Follow in up between 2 weeks and a  month can see provider or do a nurse visit.   Patient verbalized understanding of instructions.   Serge Dancer, CMA

## 2024-05-25 ENCOUNTER — Ambulatory Visit

## 2024-05-25 DIAGNOSIS — I10 Essential (primary) hypertension: Secondary | ICD-10-CM

## 2024-05-25 NOTE — Progress Notes (Signed)
 Patient here for nurse visit BP check per order from [facility].   Patient reports compliance with prescribed BP medications: yes  Last dose of BP medication: 05/25/24  BP Readings from Last 3 Encounters:  05/25/24 (!) 142/82  04/25/24 (!) 140/86  04/05/24 (!) 146/98         Pt did not show any signs of dizziness or HA during visit, pt stated that she will continue with bp medication as prescribed by provider

## 2024-07-12 ENCOUNTER — Ambulatory Visit: Admitting: Nurse Practitioner

## 2024-08-22 ENCOUNTER — Ambulatory Visit: Admitting: Nurse Practitioner

## 2024-08-24 ENCOUNTER — Ambulatory Visit: Admitting: Nurse Practitioner

## 2024-08-24 VITALS — BP 132/80 | HR 107 | Temp 98.3°F | Ht 66.0 in | Wt 228.0 lb

## 2024-08-24 DIAGNOSIS — F410 Panic disorder [episodic paroxysmal anxiety] without agoraphobia: Secondary | ICD-10-CM | POA: Diagnosis not present

## 2024-08-24 DIAGNOSIS — R000 Tachycardia, unspecified: Secondary | ICD-10-CM | POA: Diagnosis not present

## 2024-08-24 DIAGNOSIS — I10 Essential (primary) hypertension: Secondary | ICD-10-CM | POA: Diagnosis not present

## 2024-08-24 DIAGNOSIS — R7989 Other specified abnormal findings of blood chemistry: Secondary | ICD-10-CM

## 2024-08-24 MED ORDER — ESCITALOPRAM OXALATE 10 MG PO TABS: 10.0000 mg | ORAL_TABLET | Freq: Every day | ORAL | 0 refills | Status: DC

## 2024-08-24 NOTE — Progress Notes (Signed)
 Leron Glance, NP-C Phone: [phone]  Beth Murphy is a 43 y.o. female who presents today for follow up.   Discussed the use of AI scribe software for clinical note transcription with the patient, who gave verbal consent to proceed.  History of Present Illness   Beth Murphy is a 43 year old female who presents for a follow-up visit.  She has no new problems or concerns since her last visit. She has a history of basal cell carcinoma and is under regular follow-up with her dermatologist, who recently identified another basal cell lesion.  She has been monitoring her blood pressure at home, but her device is malfunctioning, making it difficult to read the systolic number. She continues to take metoprolol  (Toprol  XL) 50 mg every night. Her heart rate has not exceeded 100 bpm since starting the 50 mg dose; she reports that the 25 mg dose did not make much difference. Her heart rate averages 83-85 bpm on weekends and 93-95 bpm during stressful weekdays. No chest pain, shortness of breath, dizziness, or swelling.  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 She experiences cycles of anxiety throughout the day but feels she can manage it without medication. She sometimes wakes up at night but can usually fall back asleep. She has been walking for 30 minutes every night with her husband and occasionally with her daughter, which she feels has helped reduce her anxiety.  She takes vitamin D  4000 IU daily and vitamin B12 every other day due to limited supply. Her B12 levels were previously normal.      Social History   Tobacco Use  Smoking Status Never  Smokeless Tobacco Never    Current Outpatient Medications on File Prior to Visit  Medication Sig Dispense Refill   cholecalciferol (VITAMIN D3) 25 MCG (1000 UNIT) tablet Take 4,000 Units by mouth daily.     metoprolol  succinate (TOPROL -XL) 50 MG 24 hr tablet Take 1  tablet (50 mg total) by mouth daily. Take with or immediately following a meal. 90 tablet 3   No current facility-administered medications on file prior to visit.     ROS see history of present illness  Objective  Physical Exam Vitals:   08/24/24 0807 08/24/24 0828  BP: (!) 140/90 132/80  Pulse: (!) 107   Temp: 98.3 F (36.8 C)   SpO2: 99%     BP Readings from Last 3 Encounters:  08/24/24 132/80  05/25/24 (!) 142/82  04/25/24 (!) 140/86   Wt Readings from Last 3 Encounters:  08/24/24 228 lb (103.4 kg)  04/05/24 226 lb 12.8 oz (102.9 kg)  04/01/23 223 lb 12.8 oz (101.5 kg)    Physical Exam Constitutional:      General: She is not in acute distress.    Appearance: Normal appearance.  HENT:     Head: Normocephalic.  Cardiovascular:     Rate and Rhythm: Normal rate and regular rhythm.     Heart sounds: Normal heart sounds.  Pulmonary:     Effort: Pulmonary effort is normal.     Breath sounds: Normal breath sounds.  Skin:    General: Skin is warm and dry.  Neurological:     General: No focal deficit present.     Mental Status: She is alert.  Psychiatric:        Mood and Affect: Mood normal.        Behavior: Behavior normal.      Assessment/Plan:  Please see individual problem list.  Panic disorder Assessment & Plan: Anxiety is related to work stressors. Lexapro , an SSRI, was discussed for anxiety and depression, including potential side effects and therapeutic timeline. She is open to medication and lifestyle modifications. Start Lexapro  daily. Follow up in 4-6 weeks to evaluate response to Lexapro  and anxiety symptoms. Continue lifestyle modifications such as daily walking. Counseled patient on common side effects. Encouraged to contact if worsening symptoms, unusual behavior changes or suicidal thoughts occur.   Orders: -     Escitalopram  Oxalate; Take 1 tablet (10 mg total) by mouth daily.  Dispense: 90 tablet; Refill: 0  Primary hypertension Assessment &  Plan: Blood pressure is 140/90 mmHg, likely influenced by stress and anxiety, and is managed with Toprol  XL 50 mg. Improvement noted on recheck. Continue Toprol  XL 50 mg daily. Reassess at next appointment, consider adding additional antihypertensive if remaining uncontrolled.    Tachycardia Assessment & Plan: Heart rate is controlled with Toprol  XL 50 mg, remaining below 100 bpm during stress. Continue Toprol  XL 50 mg daily.   Low vitamin D  level Assessment & Plan: She is taking 4000 IU of vitamin D  daily. Continue. Recheck vitamin D  levels at next follow-up.        Return in about 6 weeks (around 10/05/2024) for Anxiety/Depression.   Leron Glance, NP-C [facility]

## 2024-09-12 ENCOUNTER — Encounter: Payer: Self-pay | Admitting: Nurse Practitioner

## 2024-09-12 NOTE — Assessment & Plan Note (Signed)
 Heart rate is controlled with Toprol  XL 50 mg, remaining below 100 bpm during stress. Continue Toprol  XL 50 mg daily.

## 2024-09-12 NOTE — Assessment & Plan Note (Signed)
 Anxiety is related to work stressors. Lexapro , an SSRI, was discussed for anxiety and depression, including potential side effects and therapeutic timeline. She is open to medication and lifestyle modifications. Start Lexapro  daily. Follow up in 4-6 weeks to evaluate response to Lexapro  and anxiety symptoms. Continue lifestyle modifications such as daily walking. Counseled patient on common side effects. Encouraged to contact if worsening symptoms, unusual behavior changes or suicidal thoughts occur.

## 2024-09-12 NOTE — Assessment & Plan Note (Signed)
 She is taking 4000 IU of vitamin D  daily. Continue. Recheck vitamin D  levels at next follow-up.

## 2024-09-12 NOTE — Assessment & Plan Note (Addendum)
 Blood pressure is 140/90 mmHg, likely influenced by stress and anxiety, and is managed with Toprol  XL 50 mg. Improvement noted on recheck. Continue Toprol  XL 50 mg daily. Reassess at next appointment, consider adding additional antihypertensive if remaining uncontrolled.

## 2024-10-25 ENCOUNTER — Telehealth: Payer: Self-pay

## 2024-10-25 ENCOUNTER — Encounter: Payer: Self-pay | Admitting: Nurse Practitioner

## 2024-10-25 ENCOUNTER — Telehealth: Payer: Self-pay | Admitting: Pharmacy Technician

## 2024-10-25 ENCOUNTER — Ambulatory Visit: Admitting: Nurse Practitioner

## 2024-10-25 ENCOUNTER — Other Ambulatory Visit (HOSPITAL_COMMUNITY): Payer: Self-pay

## 2024-10-25 VITALS — BP 126/84 | HR 94 | Temp 98.3°F | Ht 66.0 in | Wt 226.8 lb

## 2024-10-25 DIAGNOSIS — R7303 Prediabetes: Secondary | ICD-10-CM

## 2024-10-25 DIAGNOSIS — E66812 Obesity, class 2: Secondary | ICD-10-CM | POA: Diagnosis not present

## 2024-10-25 DIAGNOSIS — R000 Tachycardia, unspecified: Secondary | ICD-10-CM | POA: Diagnosis not present

## 2024-10-25 DIAGNOSIS — I10 Essential (primary) hypertension: Secondary | ICD-10-CM

## 2024-10-25 DIAGNOSIS — Z6836 Body mass index (BMI) 36.0-36.9, adult: Secondary | ICD-10-CM

## 2024-10-25 DIAGNOSIS — R7989 Other specified abnormal findings of blood chemistry: Secondary | ICD-10-CM | POA: Diagnosis not present

## 2024-10-25 DIAGNOSIS — F410 Panic disorder [episodic paroxysmal anxiety] without agoraphobia: Secondary | ICD-10-CM

## 2024-10-25 LAB — BASIC METABOLIC PANEL WITH GFR
BUN: 10 mg/dL (ref 6–23)
CO2: 26 meq/L (ref 19–32)
Calcium: 8.8 mg/dL (ref 8.4–10.5)
Chloride: 100 meq/L (ref 96–112)
Creatinine, Ser: 0.63 mg/dL (ref 0.40–1.20)
GFR: 107.42 mL/min (ref >60.00)
Glucose, Bld: 105 mg/dL — ABNORMAL HIGH (ref 70–99)
Potassium: 4.3 meq/L (ref 3.5–5.1)
Sodium: 136 meq/L (ref 135–145)

## 2024-10-25 LAB — HEMOGLOBIN A1C: Hgb A1c MFr Bld: 6 % (ref 4.6–6.5)

## 2024-10-25 LAB — VITAMIN D 25 HYDROXY (VIT D DEFICIENCY, FRACTURES): VITD: 28.65 ng/mL — ABNORMAL LOW (ref 30.00–100.00)

## 2024-10-25 MED ORDER — ESCITALOPRAM OXALATE 10 MG PO TABS: 10.0000 mg | ORAL_TABLET | Freq: Every day | ORAL | 2 refills | Status: AC

## 2024-10-25 MED ORDER — ZEPBOUND 2.5 MG/0.5ML ~~LOC~~ SOAJ: 2.5000 mg | SUBCUTANEOUS | 0 refills | Status: DC

## 2024-10-25 MED ORDER — ZEPBOUND 5 MG/0.5ML ~~LOC~~ SOAJ: 5.0000 mg | SUBCUTANEOUS | 0 refills | Status: DC

## 2024-10-25 NOTE — Telephone Encounter (Signed)
 PA request has been Received. New Encounter has been or will be created for follow up. For additional info see Pharmacy Prior Auth telephone encounter from 10/25/2024.

## 2024-10-25 NOTE — Progress Notes (Unsigned)
 Leron Glance, NP-C Phone: [phone]  Beth Murphy is a 43 y.o. female who presents today for follow up.   Discussed the use of AI scribe software for clinical note transcription with the patient, who gave verbal consent to proceed.  History of Present Illness   Beth Murphy is a 43 year old female who presents for follow-up on Lexapro  and discussion of weight loss medication.  She has experienced significant improvement in mood and anxiety since starting Lexapro , feeling stable throughout the day without emotional fluctuations. 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 However, she experiences manageable excessive sweating in the mornings, particularly when rushed.  She continues to take metoprolol  at a dose of 50 mg daily, which has stabilized her heart rate, averaging 84 bpm at work and dropping to the high 70s on weekends. Previously, her heart rate would reach the hundreds during stress. No recent episodes of high heart rate alerts from her watch.  She and her husband have been walking regularly, which she believes has contributed to her good blood pressure readings. She is interested in discussing weight loss medications, noting that this is the heaviest she has been, attributing weight gain to having children, her job, and returning to school. She is open to exploring options, particularly injectable medications like tirzepatide  and semaglutide.  She continues to take vitamin D , now at a dose of 5000 IU daily, as she could not find the 4000 IU dose. She is open to having her vitamin D  levels checked.  No chest pain, shortness of breath, dizziness, or swelling.      Social History   Tobacco Use  Smoking Status Never  Smokeless Tobacco Never    Current Outpatient Medications on File Prior to Visit  Medication Sig Dispense Refill   ergocalciferol  (VITAMIN D2) 1.25 MG (50000 UT)  capsule Take 5,000 Units by mouth once a week.     metoprolol  succinate (TOPROL -XL) 50 MG 24 hr tablet Take 1 tablet (50 mg total) by mouth daily. Take with or immediately following a meal. 90 tablet 3   No current facility-administered medications on file prior to visit.     ROS see history of present illness  Objective  Physical Exam Vitals:   10/25/24 0842  BP: 126/84  Pulse: 94  Temp: 98.3 F (36.8 C)  SpO2: 98%    BP Readings from Last 3 Encounters:  10/25/24 126/84  08/24/24 132/80  05/25/24 (!) 142/82   Wt Readings from Last 3 Encounters:  10/25/24 226 lb 12.8 oz (102.9 kg)  08/24/24 228 lb (103.4 kg)  04/05/24 226 lb 12.8 oz (102.9 kg)    Physical Exam Constitutional:      General: She is not in acute distress.    Appearance: Normal appearance. She is obese.  HENT:     Head: Normocephalic.  Cardiovascular:     Rate and Rhythm: Normal rate and regular rhythm.     Heart sounds: Normal heart sounds.  Pulmonary:     Effort: Pulmonary effort is normal.     Breath sounds: Normal breath sounds.  Skin:    General: Skin is warm and dry.  Neurological:     General: No focal deficit present.     Mental Status: She is alert.  Psychiatric:        Mood and Affect: Mood normal.        Behavior: Behavior normal.      Assessment/Plan:  Please see individual problem list.  Class 2 severe obesity due to excess calories with serious comorbidity and body mass index (BMI) of 36.0 to 36.9 in adult Assessment & Plan: BMI is above recommended levels with concurrent hypertension. Zepbound  is preferred. Lifestyle modifications were emphasized, and insurance options were discussed. Prescribed Zepbound  starting at 2.5 mg weekly for four weeks, then increase to 5 mg weekly. Counseled on the risk of pancreatitis and gallbladder disease. Discussed the risk of nausea. Advised to discontinue the Zepbound  and contact us  immediately if they develop abdominal pain. If they develop  excessive nausea they will contact us  right away. I discussed that medullary thyroid  cancer has been seen in rats studies. The patient confirmed no personal or family history of thyroid  cancer, parathyroid cancer, or adrenal gland cancer. Discussed that we thus far have not seen medullary thyroid  cancer result from use of this type of medication in humans. Advised to monitor the thyroid  area and contact us  for any lumps, swelling, trouble swallowing, or any other changes in this area.  Discussed goal weight loss of 1 to 2 pounds a week while on this medication. Discussed the importance of healthy diet, exercise and lifestyle modifications even with this medication. A follow-up is scheduled in six weeks to assess progress and adjust dosage. Discussed insurance coverage and self-pay options for Zepbound . We will continue to monitor.    Prediabetes Assessment & Plan: Previous A1c indicated prediabetes. Discussed monitoring A1c and lifestyle modifications to prevent diabetes. Check A1c. Encourage healthy diet and regular exercise.   Orders: -     Hemoglobin A1c  Panic disorder Assessment & Plan: Anxiety has improved and is well controlled with Lexapro . No significant episodes or panic attacks. Continue Lexapro  as prescribed.  Orders: -     Escitalopram  Oxalate; Take 1 tablet (10 mg total) by mouth daily.  Dispense: 90 tablet; Refill: 2  Tachycardia Assessment & Plan: Heart rate is controlled with Toprol  XL 50 mg, remaining below 100 bpm during stress. Continue Toprol  XL 50 mg daily.   Primary hypertension Assessment & Plan: Blood pressure is well-controlled today with metoprolol . No recent symptoms or high heart rate alerts. Continue metoprolol  50 mg daily.  Orders: -     Basic metabolic panel with GFR  Low vitamin D  level Assessment & Plan: Taking 5000 IU of vitamin D  daily due to unavailability of 4000 IU tablets. Continue supplementation. Checked vitamin D  levels.  Orders: -      VITAMIN D  25 Hydroxy (Vit-D Deficiency, Fractures)      Return in about 6 weeks (around 12/06/2024) for Follow up.   Leron Glance, NP-C [facility]

## 2024-10-25 NOTE — Telephone Encounter (Signed)
 Pharmacy Patient Advocate Encounter   Received notification from Pt Calls Messages that prior authorization for Zepbound is required/requested.   Insurance verification completed.   The patient is insured through [employer].    Effective December 15, 2022, GLP-1, GIP-GLP-1 agonists,  and other similar new molecular entities when used for  the purpose of weight loss will be excluded from [facility] coverage.

## 2024-10-25 NOTE — Telephone Encounter (Signed)
 PA needed for zepbound 

## 2024-10-26 ENCOUNTER — Ambulatory Visit: Payer: Self-pay | Admitting: Nurse Practitioner

## 2024-10-28 MED ORDER — TIRZEPATIDE-WEIGHT MANAGEMENT 2.5 MG/0.5ML ~~LOC~~ SOLN: 2.5000 mg | SUBCUTANEOUS | 0 refills | Status: DC

## 2024-10-28 MED ORDER — TIRZEPATIDE-WEIGHT MANAGEMENT 5 MG/0.5ML ~~LOC~~ SOLN: 5.0000 mg | SUBCUTANEOUS | 2 refills | Status: DC

## 2024-11-07 ENCOUNTER — Encounter: Payer: Self-pay | Admitting: Nurse Practitioner

## 2024-11-07 NOTE — Assessment & Plan Note (Signed)
 Taking 5000 IU of vitamin D  daily due to unavailability of 4000 IU tablets. Continue supplementation. Checked vitamin D  levels.

## 2024-11-07 NOTE — Assessment & Plan Note (Signed)
 Anxiety has improved and is well controlled with Lexapro . No significant episodes or panic attacks. Continue Lexapro  as prescribed.

## 2024-11-07 NOTE — Assessment & Plan Note (Signed)
 Blood pressure is well-controlled today with metoprolol . No recent symptoms or high heart rate alerts. Continue metoprolol  50 mg daily.

## 2024-11-07 NOTE — Assessment & Plan Note (Signed)
 BMI is above recommended levels with concurrent hypertension. Zepbound  is preferred. Lifestyle modifications were emphasized, and insurance options were discussed. Prescribed Zepbound  starting at 2.5 mg weekly for four weeks, then increase to 5 mg weekly. Counseled on the risk of pancreatitis and gallbladder disease. Discussed the risk of nausea. Advised to discontinue the Zepbound  and contact us  immediately if they develop abdominal pain. If they develop excessive nausea they will contact us  right away. I discussed that medullary thyroid  cancer has been seen in rats studies. The patient confirmed no personal or family history of thyroid  cancer, parathyroid cancer, or adrenal gland cancer. Discussed that we thus far have not seen medullary thyroid  cancer result from use of this type of medication in humans. Advised to monitor the thyroid  area and contact us  for any lumps, swelling, trouble swallowing, or any other changes in this area.  Discussed goal weight loss of 1 to 2 pounds a week while on this medication. Discussed the importance of healthy diet, exercise and lifestyle modifications even with this medication. A follow-up is scheduled in six weeks to assess progress and adjust dosage. Discussed insurance coverage and self-pay options for Zepbound . We will continue to monitor.

## 2024-11-07 NOTE — Assessment & Plan Note (Signed)
 Previous A1c indicated prediabetes. Discussed monitoring A1c and lifestyle modifications to prevent diabetes. Check A1c. Encourage healthy diet and regular exercise.

## 2024-11-07 NOTE — Assessment & Plan Note (Signed)
 Heart rate is controlled with Toprol  XL 50 mg, remaining below 100 bpm during stress. Continue Toprol  XL 50 mg daily.

## 2024-12-20 ENCOUNTER — Encounter: Payer: Self-pay | Admitting: Nurse Practitioner

## 2024-12-20 ENCOUNTER — Ambulatory Visit: Admitting: Nurse Practitioner

## 2024-12-20 VITALS — BP 120/84 | HR 98 | Temp 98.1°F | Ht 66.0 in | Wt 214.8 lb

## 2024-12-20 DIAGNOSIS — Z6834 Body mass index (BMI) 34.0-34.9, adult: Secondary | ICD-10-CM | POA: Diagnosis not present

## 2024-12-20 DIAGNOSIS — Z6836 Body mass index (BMI) 36.0-36.9, adult: Secondary | ICD-10-CM

## 2024-12-20 DIAGNOSIS — E66812 Obesity, class 2: Secondary | ICD-10-CM

## 2024-12-20 MED ORDER — TIRZEPATIDE-WEIGHT MANAGEMENT 7.5 MG/0.5ML ~~LOC~~ SOLN: 7.5000 mg | SUBCUTANEOUS | 2 refills | Status: AC

## 2024-12-20 MED ORDER — TIRZEPATIDE-WEIGHT MANAGEMENT 7.5 MG/0.5ML ~~LOC~~ SOLN: 7.5000 mg | SUBCUTANEOUS | 2 refills | Status: DC

## 2024-12-20 NOTE — Progress Notes (Signed)
 " Leron Glance, NP-C Phone: [phone]  Beth Murphy is a 44 y.o. female who presents today for follow up.   Discussed the use of AI scribe software for clinical note transcription with the patient, who gave verbal consent to proceed.  History of Present Illness   Beth Murphy is a 44 year old female who presents for follow-up on weight management.  She started on Zepbound  six weeks ago, initially at a dose of 2.5 mg for one month, and has since transitioned to 5 mg. She has taken three doses of the 5 mg, with the most recent dose administered on Sunday. She has lost 12 pounds since starting the medication.  She experiences a significant decrease in appetite, often having to 'force' herself to eat. No consistent nausea, describing it as 'hit or miss,' and no issues with constipation or diarrhea, although there is a change in stool consistency. She maintains regular bowel movements every morning.  Her typical diet includes two eggs with cheese and two pieces of turkey bacon for breakfast, a protein shake or yogurt with granola and berries for lunch, and a home-cooked dinner. Fast food causes stomach upset, leading to discomfort and inability to eat for the rest of the day.  She and her husband try to walk two to three miles every other day and have workout equipment at home, which she plans to use more regularly.     Tobacco Use History[1]  Medications Ordered Prior to Encounter[2]   ROS see history of present illness  Objective  Physical Exam Vitals:   12/20/24 1000  BP: 120/84  Pulse: 98  Temp: 98.1 F (36.7 C)  SpO2: 96%    BP Readings from Last 3 Encounters:  12/20/24 120/84  10/25/24 126/84  08/24/24 132/80   Wt Readings from Last 3 Encounters:  12/20/24 214 lb 12.8 oz (97.4 kg)  10/25/24 226 lb 12.8 oz (102.9 kg)  08/24/24 228 lb (103.4 kg)    Physical Exam Constitutional:      General: She is not in acute distress.    Appearance: Normal  appearance.  HENT:     Head: Normocephalic.  Cardiovascular:     Rate and Rhythm: Normal rate and regular rhythm.     Heart sounds: Normal heart sounds.  Pulmonary:     Effort: Pulmonary effort is normal.     Breath sounds: Normal breath sounds.  Skin:    General: Skin is warm and dry.  Neurological:     General: No focal deficit present.     Mental Status: She is alert.  Psychiatric:        Mood and Affect: Mood normal.        Behavior: Behavior normal.      Assessment/Plan: Please see individual problem list.  Class 2 severe obesity due to excess calories with serious comorbidity and body mass index (BMI) of 36.0 to 36.9 in adult Assessment & Plan: Managed with Zepbound , resulting in a 12-pound weight loss and decreased appetite. Occasional nausea noted. Continue Zepbound  5 mg weekly for five weeks, then increase to 7.5 mg weekly. Continue healthy diet and regular exercise. Follow-up in two months.   Orders: -     Tirzepatide -Weight Management; Inject 7.5 mg into the skin once a week.  Dispense: 2 mL; Refill: 2  BMI 34.0-34.9,adult -     Tirzepatide -Weight Management; Inject 7.5 mg into the skin once a week.  Dispense: 2 mL; Refill: 2      Return in about  2 months (around 02/17/2025) for Follow up.   Leron Glance, NP-C [facility] [place]    [1]  Social History Tobacco Use  Smoking Status Never  Smokeless Tobacco Never  [2]  Current Outpatient Medications on File Prior to Visit  Medication Sig Dispense Refill   ergocalciferol  (VITAMIN D2) 1.25 MG (50000 UT) capsule Take 5,000 Units by mouth once a week.     escitalopram  (LEXAPRO ) 10 MG tablet Take 1 tablet (10 mg total) by mouth daily. 90 tablet 2   metoprolol  succinate (TOPROL -XL) 50 MG 24 hr tablet Take 1 tablet (50 mg total) by mouth daily. Take with or immediately following a meal. 90 tablet 3   No current facility-administered medications on file prior to visit.   "

## 2024-12-20 NOTE — Assessment & Plan Note (Signed)
 Managed with Zepbound , resulting in a 12-pound weight loss and decreased appetite. Occasional nausea noted. Continue Zepbound  5 mg weekly for five weeks, then increase to 7.5 mg weekly. Continue healthy diet and regular exercise. Follow-up in two months.

## 2025-01-11 ENCOUNTER — Encounter: Payer: Self-pay | Admitting: Nurse Practitioner

## 2025-02-17 ENCOUNTER — Ambulatory Visit: Admitting: Nurse Practitioner
